# Patient Record
Sex: Male | Born: 1980 | Race: Black or African American | Hispanic: No | Marital: Married | Smoking: Never smoker
Health system: Southern US, Community
[De-identification: ages and names within clinical notes are randomized; demographics above are authoritative.]

---
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
## 2008-09-15 ENCOUNTER — Emergency Department (HOSPITAL_COMMUNITY): Admission: EM | Admit: 2008-09-15 | Discharge: 2008-09-16 | Payer: Self-pay | Admitting: Emergency Medicine

## 2010-08-19 LAB — POCT I-STAT, CHEM 8
BUN: 19 mg/dL (ref 6–23)
Calcium, Ion: 1.08 mmol/L — ABNORMAL LOW (ref 1.12–1.32)
Chloride: 107 mEq/L (ref 96–112)
Creatinine, Ser: 1.2 mg/dL (ref 0.4–1.5)
Glucose, Bld: 118 mg/dL — ABNORMAL HIGH (ref 70–99)
HCT: 48 % (ref 39.0–52.0)
Hemoglobin: 16.3 g/dL (ref 13.0–17.0)
Potassium: 3.7 mEq/L (ref 3.5–5.1)
Sodium: 140 mEq/L (ref 135–145)
TCO2: 26 mmol/L (ref 0–100)

## 2010-08-19 LAB — POCT CARDIAC MARKERS
CKMB, poc: 1 ng/mL (ref 1.0–8.0)
Myoglobin, poc: 45.3 ng/mL (ref 12–200)
Troponin i, poc: <0.05 ng/mL (ref 0.00–0.09)

## 2010-08-19 LAB — D-DIMER, QUANTITATIVE: D-Dimer, Quant: 0.24 ug/mL-FEU (ref 0.00–0.48)

## 2011-03-11 ENCOUNTER — Emergency Department (HOSPITAL_COMMUNITY)
Admission: EM | Admit: 2011-03-11 | Discharge: 2011-03-11 | Disposition: A | Discharge status: Home / Self Care | Payer: No Typology Code available for payment source | Attending: Emergency Medicine | Admitting: Emergency Medicine

## 2011-03-11 DIAGNOSIS — T1490XA Injury, unspecified, initial encounter: Secondary | ICD-10-CM | POA: Insufficient documentation

## 2011-03-11 DIAGNOSIS — Y92410 Unspecified street and highway as the place of occurrence of the external cause: Secondary | ICD-10-CM | POA: Insufficient documentation

## 2013-05-31 ENCOUNTER — Ambulatory Visit (INDEPENDENT_AMBULATORY_CARE_PROVIDER_SITE_OTHER): Payer: BC Managed Care – PPO | Admitting: Podiatry

## 2013-05-31 ENCOUNTER — Encounter: Payer: Self-pay | Admitting: Podiatry

## 2013-05-31 VITALS — BP 130/76 | HR 93 | Ht 68.0 in | Wt 210.0 lb

## 2013-05-31 DIAGNOSIS — L600 Ingrowing nail: Secondary | ICD-10-CM

## 2013-05-31 DIAGNOSIS — L03039 Cellulitis of unspecified toe: Secondary | ICD-10-CM | POA: Insufficient documentation

## 2013-05-31 MED ORDER — CEPHALEXIN 500 MG PO CAPS: 500.0000 mg | ORAL_CAPSULE | Freq: Four times a day (QID) | ORAL | Status: DC

## 2013-05-31 NOTE — Patient Instructions (Signed)
Seen for infected ingrown nail left great toe. Will do ingrown nail surgery this Friday so that he can take weekend off.  Take antibiotics as prescribed. Return this Friday for ingrown nail surgery.

## 2013-05-31 NOTE — Progress Notes (Signed)
Subjective: 33 year old male with infected ingrown nail on left great toe medial border for past 5 months.  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  He has this weekend off.  Objective: Draining ingrown nail left hallux medial border.  NO other abnormal findings.   Assessment: Infected ingrown nail left hallux medial border.   Plan: Will do Phenol and Alcohol matrixtectomy of the left great toe medial border.

## 2013-06-02 ENCOUNTER — Ambulatory Visit (INDEPENDENT_AMBULATORY_CARE_PROVIDER_SITE_OTHER): Payer: BC Managed Care – PPO | Admitting: Podiatry

## 2013-06-02 ENCOUNTER — Encounter: Payer: Self-pay | Admitting: Podiatry

## 2013-06-02 VITALS — BP 131/87 | HR 111 | Ht 68.0 in | Wt 210.0 lb

## 2013-06-02 DIAGNOSIS — L600 Ingrowing nail: Secondary | ICD-10-CM

## 2013-06-02 DIAGNOSIS — L03039 Cellulitis of unspecified toe: Secondary | ICD-10-CM

## 2013-06-02 NOTE — Progress Notes (Signed)
Seen for infected ingrown nail Patient came in to have ingrown nail surgery. He has taken antibiotics as prescribed. Left great toe injected with 5ml of 50/50 0.5% Marcaine plain and 1% Xylocaine with Epinephrine. Left great toe medial border ressected and cauterized nail matrix with Phenol and followed by Alcohol. Surgical wound dressed with Amerigel ointment.  Written home care instruction given.

## 2013-06-02 NOTE — Patient Instructions (Signed)
Ingrown nail surgery done on left great toe. Follow instruction and return next week.

## 2013-06-07 ENCOUNTER — Encounter: Payer: Self-pay | Admitting: Podiatry

## 2013-06-07 ENCOUNTER — Ambulatory Visit (INDEPENDENT_AMBULATORY_CARE_PROVIDER_SITE_OTHER): Payer: BC Managed Care – PPO | Admitting: Podiatry

## 2013-06-07 DIAGNOSIS — L600 Ingrowing nail: Secondary | ICD-10-CM

## 2013-06-07 NOTE — Progress Notes (Signed)
Follow up on ingrown nail surgery.  Wound healing is good without complication.  Continue to soak daily till wound gets completely dry. Return as needed.

## 2013-06-07 NOTE — Patient Instructions (Signed)
Follow up on left great toe nail surgery. Healing well. Continue to soak till pain stops and area gets completely dry. Return as needed.

## 2016-07-02 ENCOUNTER — Ambulatory Visit (INDEPENDENT_AMBULATORY_CARE_PROVIDER_SITE_OTHER): Payer: BC Managed Care – PPO | Admitting: Family

## 2016-07-02 ENCOUNTER — Encounter: Payer: Self-pay | Admitting: Family

## 2016-07-02 VITALS — BP 118/86 | HR 80 | Temp 98.3°F | Resp 16 | Ht 68.0 in | Wt 233.0 lb

## 2016-07-02 DIAGNOSIS — G43009 Migraine without aura, not intractable, without status migrainosus: Secondary | ICD-10-CM | POA: Diagnosis not present

## 2016-07-02 MED ORDER — RIZATRIPTAN BENZOATE 10 MG PO TABS: ORAL_TABLET | ORAL | 0 refills | Status: DC

## 2016-07-02 MED ORDER — TRAMADOL HCL 50 MG PO TABS: 50.0000 mg | ORAL_TABLET | Freq: Three times a day (TID) | ORAL | 0 refills | Status: DC | PRN

## 2016-07-02 NOTE — Patient Instructions (Signed)
Thank you for choosing [facility].  SUMMARY AND INSTRUCTIONS:  Medication:  Please start taking the Maxalt as needed for headaches.  Tramadol as needed for headaches not controlled by Tylenol or over the counter medications.   Your prescription(s) have been submitted to your pharmacy or been printed and provided for you. Please take as directed and contact our office if you believe you are having problem(s) with the medication(s) or have any questions.  Follow up:  If your symptoms worsen or fail to improve, please contact our office for further instruction, or in case of emergency go directly to the emergency room at the closest medical facility.     Migraine Headache A migraine headache is a very strong throbbing pain on one side or both sides of your head. Migraines can also cause other symptoms. Talk with your doctor about what things may bring on (trigger) your migraine headaches. Follow these instructions at home: Medicines  Take over-the-counter and prescription medicines only as told by your doctor.  Do not drive or use heavy machinery while taking prescription pain medicine.  To prevent or treat constipation while you are taking prescription pain medicine, your doctor may recommend that you:  Drink enough fluid to keep your pee (urine) clear or pale yellow.  Take over-the-counter or prescription medicines.  Eat foods that are high in fiber. These include fresh fruits and vegetables, whole grains, and beans.  Limit foods that are high in fat and processed sugars. These include fried and sweet foods. Lifestyle  Avoid alcohol.  Do not use any products that contain nicotine or tobacco, such as cigarettes and e-cigarettes. If you need help quitting, ask your doctor.  Get at least 8 hours of sleep every night.  Limit your stress. General instructions  Keep a journal to find out what may bring on your migraines. For example, write down:  What you eat and  drink.  How much sleep you get.  Any change in what you eat or drink.  Any change in your medicines.  If you have a migraine:  Avoid things that make your symptoms worse, such as bright lights.  It may help to lie down in a dark, quiet room.  Do not drive or use heavy machinery.  Ask your doctor what activities are safe for you.  Keep all follow-up visits as told by your doctor. This is important. Contact a doctor if:  You get a migraine that is different or worse than your usual migraines. Get help right away if:  Your migraine gets very bad.  You have a fever.  You have a stiff neck.  You have trouble seeing.  Your muscles feel weak or like you cannot control them.  You start to lose your balance a lot.  You start to have trouble walking.  You pass out (faint). This information is not intended to replace advice given to you by your health care provider. Make sure you discuss any questions you have with your health care provider. Document Released: 02/04/2008 Document Revised: 11/15/2015 Document Reviewed: 10/14/2015 Elsevier Interactive Patient Education  2017 [employer]

## 2016-07-02 NOTE — Assessment & Plan Note (Signed)
Symptoms and exam consistent with migraine headaches that are refractory to over-the-counter medications with increasing frequency. Neurological exam is normal. Discussed possibilities of abortive therapy and preventative therapy. Patient wishes to start with abortive therapy only. Start Maxalt as needed for migraines. Start tramadol as needed for headaches and controlled by over-the-counter medications. Advised to seek emergency care if develops worst headache of life. Due for an eye exam encouraged to see ophthalmology. Continue to monitor and follow-up in one month.

## 2016-07-02 NOTE — Progress Notes (Signed)
Subjective:    Patient ID: Dylan Sorrowharles P Pedraza Jr., male    DOB: 03/17/1981, 36 y.o.   MRN: [id number]  Chief Complaint  Patient presents with  . Establish Care    has issues with bad headaches x1 year has them almost every day    HPI:  Dylan SorrowCharles P Hamza Jr. is a 36 y.o. male who  has no past medical history on file. and presents today for an office visit to establish care.  Headaches - Associated symptoms of headaches have been going on for about 1 year. Denies any trauma or head injury. Headaches described as throbbing and located on the side of his temples. There is sensitivity to light and sound without associated nausea or vomiting. Modifying fators include Tylenol Extra-strength and essential oils which does not help very much. Does see spots on occasion. Frequency of the headaches is almost daily. No recent opthalmology exam done. Headache free days are 1-2 days per month. No testing done to this point and never evaluated. Has noted headaches are exacerbated with salty foods and pork. Denies worst headache of life.    No Known Allergies    Outpatient Medications Prior to Visit  Medication Sig Dispense Refill  . cephALEXin (KEFLEX) 500 MG capsule Take 1 capsule (500 mg total) by mouth 4 (four) times daily. 40 capsule 0   No facility-administered medications prior to visit.      History reviewed. No pertinent past medical history.    History reviewed. No pertinent surgical history.    Family History  Problem Relation Age of Onset  . Hypertension Mother   . Gout Father       Social History   Social History  . Marital status: Married    Spouse name: N/A  . Number of children: 0  . Years of education: 12   Occupational History  . Sport and exercise psychologistHousekeeping Supervisor    Social History Main Topics  . Smoking status: Never Smoker  . Smokeless tobacco: Never Used  . Alcohol use Yes     Comment: Socially  . Drug use: No  . Sexual activity: Not on file   Other Topics  Concern  . Not on file   Social History Narrative   Fun: Sleep       Review of Systems  Constitutional: Negative for chills and fever.  Eyes: Positive for photophobia. Negative for visual disturbance.  Respiratory: Negative for chest tightness and shortness of breath.   Cardiovascular: Negative for chest pain, palpitations and leg swelling.  Gastrointestinal: Negative for nausea and vomiting.  Neurological: Positive for headaches. Negative for dizziness, tremors, syncope and weakness.       Objective:    BP 118/86 (BP Location: Left Arm, Patient Position: Sitting, Cuff Size: Large)   Pulse 80   Temp 98.3 F (36.8 C) (Oral)   Resp 16   Ht 5\' 8"  (1.727 m)   Wt 233 lb (105.7 kg)   SpO2 98%   BMI 35.43 kg/m  Nursing note and vital signs reviewed.  Physical Exam  Constitutional: He is oriented to person, place, and time. He appears well-developed and well-nourished. No distress.  HENT:  Right Ear: Hearing, tympanic membrane, external ear and ear canal normal.  Left Ear: Hearing, tympanic membrane, external ear and ear canal normal.  Nose: Nose normal.  Mouth/Throat: Uvula is midline, oropharynx is clear and moist and mucous membranes are normal.  Eyes: Conjunctivae and EOM are normal. Pupils are equal, round, and reactive to light.  Cardiovascular:  Normal rate, regular rhythm, normal heart sounds and intact distal pulses.   Pulmonary/Chest: Effort normal and breath sounds normal. No respiratory distress. He has no wheezes. He has no rales. He exhibits no tenderness.  Neurological: He is alert and oriented to person, place, and time. No cranial nerve deficit.  Skin: Skin is warm and dry.  Psychiatric: He has a normal mood and affect. His behavior is normal. Judgment and thought content normal.        Assessment & Plan:   Problem List Items Addressed This Visit      Cardiovascular and Mediastinum   Migraine without aura and without status migrainosus, not intractable -  Primary    Symptoms and exam consistent with migraine headaches that are refractory to over-the-counter medications with increasing frequency. Neurological exam is normal. Discussed possibilities of abortive therapy and preventative therapy. Patient wishes to start with abortive therapy only. Start Maxalt as needed for migraines. Start tramadol as needed for headaches and controlled by over-the-counter medications. Advised to seek emergency care if develops worst headache of life. Due for an eye exam encouraged to see ophthalmology. Continue to monitor and follow-up in one month.      Relevant Medications   rizatriptan (MAXALT) 10 MG tablet   traMADol (ULTRAM) 50 MG tablet       I have discontinued Mr. Ramo's cephALEXin. I am also having him start on rizatriptan and traMADol.   Meds ordered this encounter  Medications  . rizatriptan (MAXALT) 10 MG tablet    Sig: Take 1 tablet by mouth at the onset of headache and may repeat in 2 hours if needed    Dispense:  10 tablet    Refill:  0    Order Specific Question:   Supervising Provider    Answer:   Hillard Danker A [[id number]]  . traMADol (ULTRAM) 50 MG tablet    Sig: Take 1 tablet (50 mg total) by mouth every 8 (eight) hours as needed.    Dispense:  30 tablet    Refill:  0    Order Specific Question:   Supervising Provider    Answer:   Hillard Danker A [[id number]]     Follow-up: Return in about 1 month (around 07/30/2016), or if symptoms worsen or fail to improve.   Jeanine Luz, FNP

## 2016-12-02 ENCOUNTER — Ambulatory Visit: Payer: BC Managed Care – PPO | Admitting: Family

## 2016-12-02 ENCOUNTER — Ambulatory Visit (INDEPENDENT_AMBULATORY_CARE_PROVIDER_SITE_OTHER): Payer: BC Managed Care – PPO | Admitting: Internal Medicine

## 2016-12-02 ENCOUNTER — Encounter: Payer: Self-pay | Admitting: Internal Medicine

## 2016-12-02 VITALS — BP 118/82 | HR 76 | Ht 68.0 in | Wt 214.0 lb

## 2016-12-02 DIAGNOSIS — L03116 Cellulitis of left lower limb: Secondary | ICD-10-CM | POA: Insufficient documentation

## 2016-12-02 DIAGNOSIS — M7989 Other specified soft tissue disorders: Secondary | ICD-10-CM | POA: Diagnosis not present

## 2016-12-02 MED ORDER — CEPHALEXIN 500 MG PO CAPS: 500.0000 mg | ORAL_CAPSULE | Freq: Three times a day (TID) | ORAL | 0 refills | Status: AC

## 2016-12-02 NOTE — Patient Instructions (Signed)
Please take all new medication as prescribed - the antibiotic  You will be contacted regarding the referral for: Left leg vein circulation test to make sure of no blood clot present  If the test is negative, and the antibiotic does not seem to work, please call for referral to Dematology  Please continue all other medications as before, and refills have been done if requested.  Please have the pharmacy call with any other refills you may need.  Please keep your appointments with your specialists as you may have planned

## 2016-12-02 NOTE — Progress Notes (Signed)
   Subjective:    Patient ID: Dylan Sorrowharles P Wedin Jr., male    DOB: [birth year], 36 y.o.   MRN: [id number]  HPI  Here to f./u with with 2-3 days onset distal LLE pain, redness, assoc with left leg swelling to below the knee, mild to mod, constant, not better with elevation, no recent trauma.  Has felt warm, but not sure about fever, chills, Pt denies chest pain, increased sob or doe, wheezing, orthopnea, PND,  palpitations, dizziness or syncope.   Pt denies new neurological symptoms such as new headache, or facial or extremity weakness or numbness   Pt denies polydipsia, polyuria, No past medical history on file. No past surgical history on file.  reports that he has never smoked. He has never used smokeless tobacco. He reports that he drinks alcohol. He reports that he does not use drugs. family history includes Gout in his father; Hypertension in his mother. No Known Allergies Current Outpatient Prescriptions on File Prior to Visit  Medication Sig Dispense Refill  . rizatriptan (MAXALT) 10 MG tablet Take 1 tablet by mouth at the onset of headache and may repeat in 2 hours if needed 10 tablet 0  . traMADol (ULTRAM) 50 MG tablet Take 1 tablet (50 mg total) by mouth every 8 (eight) hours as needed. 30 tablet 0   No current facility-administered medications on file prior to visit.    Review of Systems  Constitutional: Negative for other unusual diaphoresis or sweats HENT: Negative for ear discharge or swelling Eyes: Negative for other worsening visual disturbances Respiratory: Negative for stridor or other swelling  Gastrointestinal: Negative for worsening distension or other blood Genitourinary: Negative for retention or other urinary change Musculoskeletal: Negative for other MSK pain or swelling Skin: Negative for color change or other new lesions Neurological: Negative for worsening tremors and other numbness  Psychiatric/Behavioral: Negative for worsening agitation or other fatigue All  other system neg per pt    Objective:   Physical Exam BP 118/82   Pulse 76   Ht 5\' 8"  (1.727 m)   Wt 214 lb (97.1 kg)   SpO2 98%   BMI 32.54 kg/m  VS noted, non toxic Constitutional: Pt appears in NAD HENT: Head: NCAT.  Right Ear: External ear normal.  Left Ear: External ear normal.  Eyes: . Pupils are equal, round, and reactive to light. Conjunctivae and EOM are normal Nose: without d/c or deformity Neck: Neck supple. Gross normal ROM Cardiovascular: Normal rate and regular rhythm.   Pulmonary/Chest: Effort normal and breath sounds without rales or wheezing.  Neurological: Pt is alert. At baseline orientation, motor grossly intact Skin: Skin is warm. No rashes, other new lesions, but has distal LLE 4x4n cm redness, tender slightly raised approx 2x2 cm area about 2 cm above the ankle pretibial aspect with 1-2+ LLE edema to knee Psychiatric: Pt behavior is normal without agitation  No other exam findings    Assessment & Plan:

## 2016-12-04 ENCOUNTER — Ambulatory Visit (HOSPITAL_COMMUNITY)
Admission: RE | Admit: 2016-12-04 | Discharge: 2016-12-04 | Disposition: A | Discharge status: Home / Self Care | Payer: BC Managed Care – PPO | Source: Ambulatory Visit | Attending: Internal Medicine | Admitting: Internal Medicine

## 2016-12-04 ENCOUNTER — Telehealth: Payer: Self-pay

## 2016-12-04 DIAGNOSIS — M7989 Other specified soft tissue disorders: Secondary | ICD-10-CM | POA: Diagnosis not present

## 2016-12-04 NOTE — Telephone Encounter (Signed)
Call report that LLE venous was negative.

## 2016-12-04 NOTE — Telephone Encounter (Signed)
Please inform patient if he is not aware.

## 2016-12-05 NOTE — Assessment & Plan Note (Signed)
Also cant r/o CDVT - for LLE venous doppler asap

## 2016-12-05 NOTE — Assessment & Plan Note (Signed)
Mild to mod, for antibx course,  to f/u any worsening symptoms or concerns 

## 2016-12-07 NOTE — Telephone Encounter (Signed)
Pt informed that DVT test was negative.

## 2016-12-08 ENCOUNTER — Encounter: Payer: Self-pay | Admitting: Internal Medicine

## 2017-04-21 ENCOUNTER — Telehealth: Payer: Self-pay | Admitting: Family

## 2017-04-21 ENCOUNTER — Ambulatory Visit: Payer: BC Managed Care – PPO | Admitting: Family

## 2017-04-21 ENCOUNTER — Emergency Department (HOSPITAL_COMMUNITY)
Admission: EM | Admit: 2017-04-21 | Discharge: 2017-04-21 | Discharge status: Absent without leave | Payer: BC Managed Care – PPO

## 2017-04-21 ENCOUNTER — Encounter: Payer: Self-pay | Admitting: Family

## 2017-04-21 VITALS — BP 142/100 | HR 104 | Temp 98.8°F | Ht 68.0 in | Wt 223.0 lb

## 2017-04-21 DIAGNOSIS — J111 Influenza due to unidentified influenza virus with other respiratory manifestations: Secondary | ICD-10-CM | POA: Diagnosis not present

## 2017-04-21 DIAGNOSIS — R509 Fever, unspecified: Secondary | ICD-10-CM | POA: Diagnosis not present

## 2017-04-21 LAB — POCT RAPID STREP A (OFFICE): Rapid Strep A Screen: NEGATIVE

## 2017-04-21 LAB — POC INFLUENZA A&B (BINAX/QUICKVUE)
Influenza A, POC: POSITIVE — AB
Influenza B, POC: NEGATIVE

## 2017-04-21 MED ORDER — OSELTAMIVIR PHOSPHATE 75 MG PO CAPS: 75.0000 mg | ORAL_CAPSULE | Freq: Two times a day (BID) | ORAL | 0 refills | Status: DC

## 2017-04-21 NOTE — Progress Notes (Signed)
  Dylan Thornton. is a 36 y.o. male with the following history as recorded in [employer]:  Patient Active Problem List   Diagnosis Date Noted  . Left leg cellulitis 12/02/2016  . Left leg swelling 12/02/2016  . Migraine without aura and without status migrainosus, not intractable 07/02/2016  . Onychia and paronychia of toe 05/31/2013  . Ingrown nail 05/31/2013    Current Outpatient Medications  Medication Sig Dispense Refill  . rizatriptan (MAXALT) 10 MG tablet Take 1 tablet by mouth at the onset of headache and may repeat in 2 hours if needed 10 tablet 0  . traMADol (ULTRAM) 50 MG tablet Take 1 tablet (50 mg total) by mouth every 8 (eight) hours as needed. 30 tablet 0  . oseltamivir (TAMIFLU) 75 MG capsule Take 1 capsule (75 mg total) by mouth 2 (two) times daily. 10 capsule 0   No current facility-administered medications for this visit.     Allergies: Patient has no known allergies.  History reviewed. No pertinent past medical history.  History reviewed. No pertinent surgical history.  Family History  Problem Relation Age of Onset  . Hypertension Mother   . Gout Father     Social History   Tobacco Use  . Smoking status: Never Smoker  . Smokeless tobacco: Never Used  Substance Use Topics  . Alcohol use: Yes    Comment: Socially    Subjective:  Patient presents with concerns for body aches, "severe headache", dizziness, weakness; does complain of neck pain as well but notes he is not light sensitive and can turn his head; he has been running "high fevers" per his wife who accompanies him today. Does not have a history of hypertension.   Objective:  Vitals:   04/21/17 1435  BP: (!) 142/100  Pulse: (!) 104  Temp: 98.8 F (37.1 C)  TempSrc: Oral  SpO2: 98%  Weight: 223 lb (101.2 kg)  Height: 5\' 8"  (1.727 m)    General: Well developed, well nourished, in mild distress; actively vomiting in room; sweat noted on forehead/ clothes are soaked Skin : Warm and dry.  Head:  Normocephalic and atraumatic  Eyes: Sclera and conjunctiva clear; pupils round and reactive to light; extraocular movements intact  Ears: External normal; canals clear; tympanic membranes normal  Oropharynx: Pink, supple. No suspicious lesions  Neck: Supple without thyromegaly, adenopathy  Lungs: Respirations unlabored; clear to auscultation bilaterally without wheeze, rales, rhonchi  CVS exam: normal rate and regular rhythm.  Neurologic: Alert and oriented; speech intact; face symmetrical; moves all extremities well; CNII-XII intact without focal deficit   Assessment:  1. Flu   2. Fever, unspecified fever cause     Plan:  Rapid flu in office is positive; will send in Rx for Tamiflu 75 mg bid x 5 days; based on clinical presentation however, am concerned for dehydration; recommend ER evaluation for possible IV fluids; with severity of headache, meningitis also needs to be considered; follow-up as needed; work note given for remainder of the week.   No Follow-up on file.  Orders Placed This Encounter  Procedures  . POCT rapid strep A  . POC Influenza A&B (Binax test)    Requested Prescriptions   Signed Prescriptions Disp Refills  . oseltamivir (TAMIFLU) 75 MG capsule 10 capsule 0    Sig: Take 1 capsule (75 mg total) by mouth 2 (two) times daily.

## 2017-04-21 NOTE — Telephone Encounter (Unsigned)
Copied from CRM [id number]. Topic: General - Other >> Apr 21, 2017  3:51 PM Raquel SarnaHayes, Teresa G wrote: [facility] called stating they do not take walk in pts.  Pts said someone from the practice told him to come to the hospital for fluids.  [facility] is sending him to the ER.

## 2017-04-22 ENCOUNTER — Ambulatory Visit: Payer: Self-pay

## 2017-04-22 NOTE — Telephone Encounter (Signed)
Pt. and Fiance called to report pt c/o mid to lower sternal chest pain.  Reported the pain started at about 3:00 AM.  Pt. stated the pain is dull, and contant in the front of the chest.  Stated when he coughs, the pain goes through to the back.  Also, reported he feels sweaty.  Denied radiation of pain to the neck, jaw, shoulder, or left arm.  Reported he was diagnosed with the flu yesterday.  Evaluated in the office, and was advised to go to the ER yesterday.  C/o intermittent cough with mucus.  Stated temp. last night in the ER was 102 degrees.  Reported they waited in the ER awhile, and due to the amount of wait time, were advised to go home and get the Tamiflu started.  Fiance reported he was not having chest pain at that time, as it started in middle of night.  Reported last dose of Tylenol was at 7:30 PM last night.  Pt. has not checked temp. today.  Denies shortness of breath.  Reported some chest discomfort with taking deep breath.    Call[facility] Flow Coordinator at [facility].  Advised to send to ER.  Advised pt. and fiance to go to the ER right away, to r/o any cardiac involvement.  Verb. Understanding.     Reason for Disposition . Taking a deep breath makes pain worse  Answer Assessment - Initial Assessment Questions 1. LOCATION: "Where does it hurt?"       Middle of chest on lower half of sternum 2. RADIATION: "Does the pain go anywhere else?" (e.g., into neck, jaw, arms, back)     With cough pain radiates from middle to back; c/o ant. Chest pain at rest 3. ONSET: "When did the chest pain begin?" (Minutes, hours or days)      When he woke up about 3:00 AM 4. PATTERN "Does the pain come and go, or has it been constant since it started?"  "Does it get worse with exertion?"      Constant  5. DURATION: "How long does it last" (e.g., seconds, minutes, hours)     constant 6. SEVERITY: "How bad is the pain?"  (e.g., Scale 1-10; mild, moderate, or severe)    - MILD (1-3): doesn't interfere with  normal activities     - MODERATE (4-7): interferes with normal activities or awakens from sleep    - SEVERE (8-10): excruciating pain, unable to do any normal activities       Moderate pain @ 5/10 7. CARDIAC RISK FACTORS: "Do you have any history of heart problems or risk factors for heart disease?" (e.g., prior heart attack, angina; high blood pressure, diabetes, being overweight, high cholesterol, smoking, or strong family history of heart disease)    Strong family hx heart disease; aunt  608. PULMONARY RISK FACTORS: "Do you have any history of lung disease?"  (e.g., blood clots in lung, asthma, emphysema, birth control pills)     No ; no asthma or emphysema 9. CAUSE: "What do you think is causing the chest pain?"     Unknown  10. OTHER SYMPTOMS: "Do you have any other symptoms?" (e.g., dizziness, nausea, vomiting, sweating, fever, difficulty breathing, cough)       Feels sweaty, nausea/ fever of 102 degrees at the ER 11. PREGNANCY: "Is there any chance you are pregnant?" "When was your last menstrual period?"       N/a  Protocols used: CHEST PAIN-A-AH

## 2017-07-28 ENCOUNTER — Ambulatory Visit: Payer: BC Managed Care – PPO | Admitting: Family

## 2017-07-28 DIAGNOSIS — Z0289 Encounter for other administrative examinations: Secondary | ICD-10-CM

## 2017-09-23 ENCOUNTER — Ambulatory Visit: Payer: BC Managed Care – PPO | Admitting: Nurse Practitioner

## 2017-09-24 ENCOUNTER — Encounter: Payer: Self-pay | Admitting: Family

## 2017-09-24 ENCOUNTER — Ambulatory Visit (INDEPENDENT_AMBULATORY_CARE_PROVIDER_SITE_OTHER): Payer: BC Managed Care – PPO | Admitting: Family

## 2017-09-24 ENCOUNTER — Other Ambulatory Visit (INDEPENDENT_AMBULATORY_CARE_PROVIDER_SITE_OTHER): Payer: BC Managed Care – PPO

## 2017-09-24 VITALS — BP 118/78 | HR 77 | Temp 98.0°F | Ht 66.0 in | Wt 213.8 lb

## 2017-09-24 DIAGNOSIS — Z23 Encounter for immunization: Secondary | ICD-10-CM

## 2017-09-24 DIAGNOSIS — Z8269 Family history of other diseases of the musculoskeletal system and connective tissue: Secondary | ICD-10-CM

## 2017-09-24 DIAGNOSIS — Z13220 Encounter for screening for lipoid disorders: Secondary | ICD-10-CM | POA: Diagnosis not present

## 2017-09-24 DIAGNOSIS — Z0000 Encounter for general adult medical examination without abnormal findings: Secondary | ICD-10-CM

## 2017-09-24 DIAGNOSIS — G43009 Migraine without aura, not intractable, without status migrainosus: Secondary | ICD-10-CM

## 2017-09-24 DIAGNOSIS — Z114 Encounter for screening for human immunodeficiency virus [HIV]: Secondary | ICD-10-CM

## 2017-09-24 LAB — LIPID PANEL
CHOLESTEROL: 174 mg/dL (ref 0–200)
HDL: 37.4 mg/dL — ABNORMAL LOW (ref >39.00)
LDL CALC: 119 mg/dL — AB (ref 0–99)
NonHDL: 136.4
Total CHOL/HDL Ratio: 5
Triglycerides: 88 mg/dL (ref 0.0–149.0)
VLDL: 17.6 mg/dL (ref 0.0–40.0)

## 2017-09-24 LAB — COMPREHENSIVE METABOLIC PANEL
ALBUMIN: 4.6 g/dL (ref 3.5–5.2)
ALT: 16 U/L (ref 0–53)
AST: 18 U/L (ref 0–37)
Alkaline Phosphatase: 74 U/L (ref 39–117)
BILIRUBIN TOTAL: 1 mg/dL (ref 0.2–1.2)
BUN: 15 mg/dL (ref 6–23)
CHLORIDE: 103 meq/L (ref 96–112)
CO2: 31 meq/L (ref 19–32)
CREATININE: 1.16 mg/dL (ref 0.40–1.50)
Calcium: 9.4 mg/dL (ref 8.4–10.5)
GFR: 92.38 mL/min (ref >60.00)
Glucose, Bld: 91 mg/dL (ref 70–99)
Potassium: 4.4 mEq/L (ref 3.5–5.1)
SODIUM: 139 meq/L (ref 135–145)
Total Protein: 7.4 g/dL (ref 6.0–8.3)

## 2017-09-24 LAB — CBC WITH DIFFERENTIAL/PLATELET
BASOS ABS: 0 10*3/uL (ref 0.0–0.1)
BASOS PCT: 0.6 % (ref 0.0–3.0)
Eosinophils Absolute: 0 10*3/uL (ref 0.0–0.7)
Eosinophils Relative: 0.9 % (ref 0.0–5.0)
HEMATOCRIT: 47.9 % (ref 39.0–52.0)
Hemoglobin: 16.3 g/dL (ref 13.0–17.0)
LYMPHS PCT: 38.1 % (ref 12.0–46.0)
Lymphs Abs: 1.8 10*3/uL (ref 0.7–4.0)
MCHC: 33.9 g/dL (ref 30.0–36.0)
MCV: 85 fl (ref 78.0–100.0)
MONOS PCT: 10.6 % (ref 3.0–12.0)
Monocytes Absolute: 0.5 10*3/uL (ref 0.1–1.0)
NEUTROS ABS: 2.4 10*3/uL (ref 1.4–7.7)
Neutrophils Relative %: 49.8 % (ref 43.0–77.0)
PLATELETS: 248 10*3/uL (ref 150.0–400.0)
RBC: 5.64 Mil/uL (ref 4.22–5.81)
RDW: 14.2 % (ref 11.5–15.5)
WBC: 4.8 10*3/uL (ref 4.0–10.5)

## 2017-09-24 LAB — URIC ACID: Uric Acid, Serum: 8.1 mg/dL — ABNORMAL HIGH (ref 4.0–7.8)

## 2017-09-24 MED ORDER — RIZATRIPTAN BENZOATE 10 MG PO TABS: ORAL_TABLET | ORAL | 1 refills | Status: DC

## 2017-09-24 NOTE — Progress Notes (Signed)
Dylan Thornton. is a 37 y.o. male with the following history as recorded in [employer]:  Patient Active Problem List   Diagnosis Date Noted  . Left leg cellulitis 12/02/2016  . Left leg swelling 12/02/2016  . Migraine without aura and without status migrainosus, not intractable 07/02/2016  . Onychia and paronychia of toe 05/31/2013  . Ingrown nail 05/31/2013    Current Outpatient Medications  Medication Sig Dispense Refill  . rizatriptan (MAXALT) 10 MG tablet Take 1 tablet by mouth at the onset of headache and may repeat in 2 hours if needed 10 tablet 1   No current facility-administered medications for this visit.     Allergies: Patient has no known allergies.  History reviewed. No pertinent past medical history.  History reviewed. No pertinent surgical history.  Family History  Problem Relation Age of Onset  . Hypertension Mother   . Gout Father     Social History   Tobacco Use  . Smoking status: Never Smoker  . Smokeless tobacco: Never Used  Substance Use Topics  . Alcohol use: Yes    Comment: Socially    Subjective:  Patient presents for yearly CPE; will be getting married next Saturday; in baseline state of health with no concerns today; exercises regularly at work; has lost almost 20 pounds in the past year by limiting sodas, eating better; dentist every 6 months; agrees to update Tdap today; does have FH of gout- no personal symptoms; may have one migraine every 3-4 months- frequency much less since changing his diet; would like a refill on his Maxalt;   Objective:  Vitals:   09/24/17 1121  BP: 118/78  Pulse: 77  Temp: 98 F (36.7 C)  TempSrc: Oral  SpO2: 96%  Weight: 213 lb 12 oz (97 kg)  Height: '5\' 6"'  (1.676 m)    General: Well developed, well nourished, in no acute distress  Skin : Warm and dry.  Head: Normocephalic and atraumatic  Eyes: Sclera and conjunctiva clear; pupils round and reactive to light; extraocular movements intact  Ears: External  normal; canals clear; tympanic membranes normal  Oropharynx: Pink, supple. No suspicious lesions  Neck: Supple without thyromegaly, adenopathy  Lungs: Respirations unlabored; clear to auscultation bilaterally without wheeze, rales, rhonchi  CVS exam: normal rate and regular rhythm.  Abdomen: Soft; nontender; nondistended; normoactive bowel sounds; no masses or hepatosplenomegaly  Musculoskeletal: No deformities; no active joint inflammation  Extremities: No edema, cyanosis, clubbing  Vessels: Symmetric bilaterally  Neurologic: Alert and oriented; speech intact; face symmetrical; moves all extremities well; CNII-XII intact without focal deficit  Assessment:  1. PE (physical exam), annual   2. Need for Tdap vaccination   3. Migraine without aura and without status migrainosus, not intractable   4. Lipid screening   5. Family history of gout   6. Encounter for screening for HIV     Plan:  Congratulated patient on commitment to health; continue regular dental and vision exams; Tdap updated; labs updated- will be in touch once results obtained; follow-up in 1-2 years, sooner prn.   No follow-ups on file.  Orders Placed This Encounter  Procedures  . Tdap vaccine greater than or equal to 7yo IM  . CBC w/Diff    Standing Status:   Future    Number of Occurrences:   1    Standing Expiration Date:   09/24/2018  . Comp Met (CMET)    Standing Status:   Future    Number of Occurrences:   1  Standing Expiration Date:   09/24/2018  . Lipid panel    Standing Status:   Future    Number of Occurrences:   1    Standing Expiration Date:   09/25/2018  . HIV antibody    Standing Status:   Future    Number of Occurrences:   1    Standing Expiration Date:   09/25/2018  . Uric acid    Standing Status:   Future    Number of Occurrences:   1    Standing Expiration Date:   09/24/2018    Requested Prescriptions   Signed Prescriptions Disp Refills  . rizatriptan (MAXALT) 10 MG tablet 10 tablet 1     Sig: Take 1 tablet by mouth at the onset of headache and may repeat in 2 hours if needed

## 2017-09-24 NOTE — Patient Instructions (Signed)
Congratulations!! Enjoy your honeymoon! Let us know if you need anything.

## 2017-09-25 LAB — HIV ANTIBODY (ROUTINE TESTING W REFLEX): HIV: NONREACTIVE

## 2017-09-28 ENCOUNTER — Other Ambulatory Visit: Payer: Self-pay | Admitting: Family

## 2017-09-28 DIAGNOSIS — R899 Unspecified abnormal finding in specimens from other organs, systems and tissues: Secondary | ICD-10-CM

## 2018-06-02 ENCOUNTER — Encounter: Payer: Self-pay | Admitting: Family Medicine

## 2018-06-02 ENCOUNTER — Ambulatory Visit: Payer: BC Managed Care – PPO | Admitting: Family Medicine

## 2018-06-02 VITALS — BP 132/82 | HR 105 | Temp 97.4°F | Ht 66.0 in | Wt 228.1 lb

## 2018-06-02 DIAGNOSIS — G43009 Migraine without aura, not intractable, without status migrainosus: Secondary | ICD-10-CM

## 2018-06-02 MED ORDER — TOPIRAMATE 25 MG PO TABS: ORAL_TABLET | ORAL | 1 refills | Status: DC

## 2018-06-02 NOTE — Progress Notes (Signed)
Patient ID: Dylan Sorrow., male   DOB: [birth year], 38 y.o.   MRN: [id number]  PCP: Olive Bass, FNP  Subjective:  Dylan Sorrow. is a 38 y.o. year old very pleasant male patient who presents with a Headache that occurred yesterday. He is seeking care today as he experiences migraines every 2 to 3 months but has experienced more this past month. He reports that he has treated more than 10 days/month at times and states this may have occurred over the past 3 months.  -started: yesterday , symptoms improved after taking Excedrin extra strength -Pain: 10 Quality: throbbing and unilateral Radiation: No -Duration: can last 6 to 8 hours and improves with excedrin -Trigger: None noted  -Trauma: No -Stressors: None -Relation to Food/Alcohol: No -Nausea/Vomiting: Nausea without vomiting -Fever: No -Weight Loss: No -History of CA/immunosuppresion: No -Visual Changes: He reports some intermittent blurry vision but did not notice this yesterday. -Seizures: No Light and sound sensitivity present with headaches.  -previous treatments: Excedrin has provided benefit -sick contacts/travel/risks: No recent sick contact exposure -Family Hx of headache: No  ROS-denies fever, chills, thunderclap HA, sinus pressure/pain, ear pain,   Pertinent Past Medical History- migraines  Medications- reviewed  Current Outpatient Medications  Medication Sig Dispense Refill  . rizatriptan (MAXALT) 10 MG tablet Take 1 tablet by mouth at the onset of headache and may repeat in 2 hours if needed (Patient not taking: Reported on 06/02/2018) 10 tablet 1   No current facility-administered medications for this visit.     Objective: BP 132/82 (BP Location: Left Arm, Patient Position: Sitting, Cuff Size: Normal)   Pulse (!) 105   Temp (!) 97.4 F (36.3 C) (Oral)   Ht 5\' 6"  (1.676 m)   Wt 228 lb 1.9 oz (103.5 kg)   SpO2 94%   BMI 36.82 kg/m  Retake of HR:  98 Gen: NAD, resting comfortably HEENT:  Oropharynx clear and moist CV: RRR no murmurs rubs or gallops Lungs: CTAB no crackles, wheeze, rhonchi Abdomen: soft/nontender/nondistended/normal bowel sounds. No rebound or guarding.  Ext: no edema Skin: warm, dry, no rash Neuro: II-Visual fields grossly intact. III/IV/VI-Extraocular movements intact. Pupils reactive bilaterally. V/VII-Smile symmetric, equal eyebrow raise, facial sensation intact VIII- Hearing grossly intact XI-bilateral shoulder shrug XII-midline tongue extension Motor: 5/5 bilaterally with normal tone and bulk Cerebellar: Normal finger-to-nose and normal heel-to-shin test.  Romberg negative Ambulates with a coordinated gait  Assessment/Plan: 1. Migraine without aura and without status migrainosus, not intractable Exam and history are most consistent with migraines that have increased in frequency and there is some suspicion of medication overuse headache/chronic migraines present with treatment > 10 days/month. Neuro exam is normal and reassuring today. We reviewed abortive and preventive therapy today. He is interested in a trial of preventive therapy today with a goal to decrease acute therapy. Will trial topamax 25 mg once daily x 7 days and increase to BID. He will avoid dietary triggers and keep a headache diary to further determine patterns/triggers. He will follow up with PCP after trial of medication in the next 2 weeks or sooner if needed. Further advised him that if symptoms are not improving, imaging can be considered.  - topiramate (TOPAMAX) 25 MG tablet; Take one tablet before bed for 7 days and then increase to one tablet two times daily  Dispense: 30 tablet; Refill: 1    Inez Catalina, FNP

## 2018-06-02 NOTE — Patient Instructions (Signed)
Please start taking medication once daily at night for one week then increase to one tablet twice a day.  It is important to keep a diary of your headaches and also follow up with Vernona Rieger for further evaluation.  If symptoms are not improving, worsen, please seek care for further evaluation.   Migraine Headache  A migraine headache is a very strong throbbing pain on one side or both sides of your head. Migraines can also cause other symptoms. Talk with your doctor about what things may bring on (trigger) your migraine headaches. Follow these instructions at home: Medicines  Take over-the-counter and prescription medicines only as told by your doctor.  Do not drive or use heavy machinery while taking prescription pain medicine.  To prevent or treat constipation while you are taking prescription pain medicine, your doctor may recommend that you: ? Drink enough fluid to keep your pee (urine) clear or pale yellow. ? Take over-the-counter or prescription medicines. ? Eat foods that are high in fiber. These include fresh fruits and vegetables, whole grains, and beans. ? Limit foods that are high in fat and processed sugars. These include fried and sweet foods. Lifestyle  Avoid alcohol.  Do not use any products that contain nicotine or tobacco, such as cigarettes and e-cigarettes. If you need help quitting, ask your doctor.  Get at least 8 hours of sleep every night.  Limit your stress. General instructions   Keep a journal to find out what may bring on your migraines. For example, write down: ? What you eat and drink. ? How much sleep you get. ? Any change in what you eat or drink. ? Any change in your medicines.  If you have a migraine: ? Avoid things that make your symptoms worse, such as bright lights. ? It may help to lie down in a dark, quiet room. ? Do not drive or use heavy machinery. ? Ask your doctor what activities are safe for you.  Keep all follow-up visits as told  by your doctor. This is important. Contact a doctor if:  You get a migraine that is different or worse than your usual migraines. Get help right away if:  Your migraine gets very bad.  You have a fever.  You have a stiff neck.  You have trouble seeing.  Your muscles feel weak or like you cannot control them.  You start to lose your balance a lot.  You start to have trouble walking.  You pass out (faint). This information is not intended to replace advice given to you by your health care provider. Make sure you discuss any questions you have with your health care provider. Document Released: 02/04/2008 Document Revised: 01/19/2018 Document Reviewed: 10/14/2015 Elsevier Interactive Patient Education  2019 [employer].

## 2018-07-12 ENCOUNTER — Ambulatory Visit: Payer: BC Managed Care – PPO | Admitting: Family Medicine

## 2018-07-12 ENCOUNTER — Encounter: Payer: Self-pay | Admitting: Family Medicine

## 2018-07-12 ENCOUNTER — Ambulatory Visit: Payer: Self-pay

## 2018-07-12 VITALS — BP 126/92 | HR 104 | Ht 66.0 in | Wt 228.0 lb

## 2018-07-12 DIAGNOSIS — M25511 Pain in right shoulder: Principal | ICD-10-CM

## 2018-07-12 DIAGNOSIS — G8929 Other chronic pain: Secondary | ICD-10-CM

## 2018-07-12 DIAGNOSIS — M7551 Bursitis of right shoulder: Secondary | ICD-10-CM | POA: Insufficient documentation

## 2018-07-12 MED ORDER — GABAPENTIN 100 MG PO CAPS: 200.0000 mg | ORAL_CAPSULE | Freq: Every day | ORAL | 3 refills | Status: DC

## 2018-07-12 MED ORDER — TIZANIDINE HCL 4 MG PO TABS: 4.0000 mg | ORAL_TABLET | Freq: Every evening | ORAL | 2 refills | Status: AC

## 2018-07-12 NOTE — Assessment & Plan Note (Signed)
Patient coming in action.  Discussed icing regimen exercises.  Discussed which activities to do which wants to avoid.  Discussed topical anti-inflammatories.  Follow-up again 4 to 8 weeks.

## 2018-07-12 NOTE — Patient Instructions (Addendum)
Good to see you  Ice is your friend Ice 20 minutes 2 times daily. Usually after activity and before bed. Injected shoulder today   Exercises 3 times a week.  Gabapentin 200mg  at night If need something else use  zanaflex at night See me again in 4 weeks

## 2018-07-12 NOTE — Progress Notes (Signed)
[name] [address] [place], [place] [address] Phone: [phone] Subjective:   Dylan Thornton, am serving as a scribe for Dr. Antoine Thornton.   CC: Right shoulder pain  GEX:[id number]  Dylan Thornton. is a 38 y.o. male coming in with complaint of right shoulder pain. Wakes up in pain and has pain with flexion over the deltoid. Uses Tylenol to sleep. Denies any radiating symptoms. Has had similar pain a couple years ago and as given Toradal for pain. 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 Rates the severity of pain is 7 out of 10     No past medical history on file. No past surgical history on file. Social History   Socioeconomic History  . Marital status: Married    Spouse name: Not on file  . Number of children: 0  . Years of education: 55  . Highest education level: Not on file  Occupational History  . Occupation: Sport and exercise psychologist  Social Needs  . Financial resource strain: Not on file  . Food insecurity:    Worry: Not on file    Inability: Not on file  . Transportation needs:    Medical: Not on file    Non-medical: Not on file  Tobacco Use  . Smoking status: Never Smoker  . Smokeless tobacco: Never Used  Substance and Sexual Activity  . Alcohol use: Yes    Comment: Socially  . Drug use: No  . Sexual activity: Not on file  Lifestyle  . Physical activity:    Days per week: Not on file    Minutes per session: Not on file  . Stress: Not on file  Relationships  . Social connections:    Talks on phone: Not on file    Gets together: Not on file    Attends religious service: Not on file    Active member of club or organization: Not on file    Attends meetings of clubs or organizations: Not on file    Relationship status: Not on file  Other Topics Concern  . Not on file  Social History Narrative   Fun: Sleep    No Known  Allergies Family History  Problem Relation Age of Onset  . Hypertension Mother   . Gout Father        Current Outpatient Medications (Analgesics):  .  rizatriptan (MAXALT) 10 MG tablet, Take 1 tablet by mouth at the onset of headache and may repeat in 2 hours if needed   Current Outpatient Medications (Other):  .  topiramate (TOPAMAX) 25 MG tablet, Take one tablet before bed for 7 days and then increase to one tablet two times daily .  gabapentin (NEURONTIN) 100 MG capsule, Take 2 capsules (200 mg total) by mouth at bedtime. Marland Kitchen  tiZANidine (ZANAFLEX) 4 MG tablet, Take 1 tablet (4 mg total) by mouth Nightly for 10 days.    Past medical history, social, surgical and family history all reviewed in electronic medical record.  No pertanent information unless stated regarding to the chief complaint.   Review of Systems:  No headache, visual changes, nausea, vomiting, diarrhea, constipation, dizziness, abdominal pain, skin rash, fevers, chills, night sweats, weight loss, swollen lymph nodes, body aches, joint swelling, chest pain, shortness of breath, mood changes.  Positive muscle aches  Objective  Blood pressure (!) 126/92, pulse (!) 104, height 5'  6" (1.676 m), weight 228 lb (103.4 kg), SpO2 95 %.   General: No apparent distress alert and oriented x3 mood and affect normal, dressed appropriately.  HEENT: Pupils equal, extraocular movements intact  Respiratory: Patient's speak in full sentences and does not appear short of breath  Cardiovascular: No lower extremity edema, non tender, no erythema  Skin: Warm dry intact with no signs of infection or rash on extremities or on axial skeleton.  Abdomen: Soft nontender  Neuro: Cranial nerves II through XII are intact, neurovascularly intact in all extremities with 2+ DTRs and 2+ pulses.  Lymph: No lymphadenopathy of posterior or anterior cervical chain or axillae bilaterally.  Gait normal with good balance and coordination.  MSK:  Non  tender with full range of motion and good stability and symmetric strength and tone of  elbows, wrist, hip, knee and ankles bilaterally.  Shoulder: Right Inspection reveals no abnormalities, atrophy or asymmetry. Palpation is normal with no tenderness over AC joint or bicipital groove. ROM is full in all planes passively. Rotator cuff strength normal throughout. signs of impingement with positive Neer and Hawkin's tests, but negative empty can sign. Speeds and Yergason's tests normal. No labral pathology noted with negative Obrien's, negative clunk and good stability. Normal scapular function observed. No painful arc and no drop arm sign. No apprehension sign  MSK US performed of: Right This study was ordered, performed, and interpreted by Dylan Thornton D.O.  Shoulder:   Supraspinatus:  Appears normal on long and transverse views, Bursal bulge seen with shoulder abduction on impingement view. Infraspinatus:  Appears normal on long and transverse views. Significant increase in Doppler flow Subscapularis:  Appears normal on long and transverse views. Positive bursa Teres Minor:  Appears normal on long and transverse views. AC joint:  Capsule undistended, no geyser sign. Glenohumeral Joint:  Appears normal without effusion. Glenoid Labrum:  Intact without visualized tears. Biceps Tendon:  Appears normal on long and transverse views, no fraying of tendon, tendon located in intertubercular groove, no subluxation with shoulder internal or external rotation.  Impression: Subacromial bursitis  Procedure: Real-time Ultrasound Guided Injection of right glenohumeral joint Device: GE Logiq E  Ultrasound guided injection is preferred based studies that show increased duration, increased effect, greater accuracy, decreased procedural pain, increased response rate with ultrasound guided versus blind injection.  Verbal informed consent obtained.  Time-out conducted.  Noted no overlying erythema,  induration, or other signs of local infection.  Skin prepped in a sterile fashion.  Local anesthesia: Topical Ethyl chloride.  With sterile technique and under real time ultrasound guidance:  Joint visualized.  23g 1  inch needle inserted posterior approach. Pictures taken for needle placement. Patient did have injection of 2 cc of 1% lidocaine, 2 cc of 0.5% Marcaine, and 1.0 cc of Kenalog 40 mg/dL. Completed without difficulty  Pain immediately resolved suggesting accurate placement of the medication.  Advised to call if fevers/chills, erythema, induration, drainage, or persistent bleeding.  Images permanently stored and available for review in the ultrasound unit.  Impression: Technically successful ultrasound guided injection.  [id number]; 15 additional minutes spent for Therapeutic exercises as stated in above notes.  This included exercises focusing on stretching, strengthening, with significant focus on eccentric aspects.   Long term goals include an improvement in range of motion, strength, endurance as well as avoiding reinjury. Patient's frequency would include in 1-2 times a day, 3-5 times a week for a duration of 6-12 weeks. Shoulder Exercises that included:  Basic scapular stabilization to  include adduction and depression of scapula Scaption, focusing on proper movement and good control Internal and External rotation utilizing a theraband, with elbow tucked at side entire time Rows with theraband   Proper technique shown and discussed handout in great detail with ATC.  All questions were discussed and answered.      Impression and Recommendations:     This case required medical decision making of moderate complexity. The above documentation has been reviewed and is accurate and complete Dylan Saa, DO       Note: This dictation was prepared with Dragon dictation along with smaller phrase technology. Any transcriptional errors that result from this process are unintentional.

## 2018-08-09 ENCOUNTER — Ambulatory Visit: Payer: BC Managed Care – PPO | Admitting: Family Medicine

## 2019-05-08 ENCOUNTER — Encounter: Payer: Self-pay | Admitting: Emergency Medicine

## 2019-05-08 ENCOUNTER — Ambulatory Visit
Admission: EM | Admit: 2019-05-08 | Discharge: 2019-05-08 | Disposition: A | Discharge status: Home / Self Care | Payer: BC Managed Care – PPO | Attending: Emergency Medicine | Admitting: Emergency Medicine

## 2019-05-08 ENCOUNTER — Other Ambulatory Visit: Payer: Self-pay

## 2019-05-08 DIAGNOSIS — R5383 Other fatigue: Secondary | ICD-10-CM | POA: Diagnosis not present

## 2019-05-08 DIAGNOSIS — R030 Elevated blood-pressure reading, without diagnosis of hypertension: Secondary | ICD-10-CM

## 2019-05-08 DIAGNOSIS — U071 COVID-19: Secondary | ICD-10-CM | POA: Diagnosis not present

## 2019-05-08 DIAGNOSIS — R110 Nausea: Secondary | ICD-10-CM | POA: Diagnosis not present

## 2019-05-08 LAB — POC SARS CORONAVIRUS 2 AG -  ED: SARS Coronavirus 2 Ag: POSITIVE — AB

## 2019-05-08 NOTE — ED Triage Notes (Signed)
Patient in office today c/o nausea,dizziness sx started this morning. Also stated last was feeling bodyaches  OTC,Tylenol

## 2019-05-08 NOTE — ED Provider Notes (Signed)
[facility]    CSN: [id number] Arrival date & time: 05/08/19  1322      History   Chief Complaint Chief Complaint  Patient presents with  . Nausea  . Dizziness    HPI Dylan Lamingharles P Tetzlaff Montez HagemanJr. is a 38 y.o. male.   Patient presents with fatigue, nausea, dizziness, and body aches since yesterday.  He has attempted treatment at home with Tylenol.  He denies fever, chills, sore throat, cough, shortness of breath, vomiting, diarrhea, rash, or other symptoms.  The history is provided by the patient.    History reviewed. No pertinent past medical history.  Patient Active Problem List   Diagnosis Date Noted  . Acute bursitis of right shoulder 07/12/2018  . Left leg cellulitis 12/02/2016  . Left leg swelling 12/02/2016  . Migraine without aura and without status migrainosus, not intractable 07/02/2016  . Onychia and paronychia of toe 05/31/2013  . Ingrown nail 05/31/2013    No past surgical history on file.     Home Medications    Prior to Admission medications   Medication Sig Start Date End Date Taking? Authorizing Provider  gabapentin (NEURONTIN) 100 MG capsule Take 2 capsules (200 mg total) by mouth at bedtime. 07/12/18   Judi SaaSmith, Zachary M, DO  rizatriptan (MAXALT) 10 MG tablet Take 1 tablet by mouth at the onset of headache and may repeat in 2 hours if needed 09/24/17   Olive BassMurray, Laura Woodruff, FNP  topiramate (TOPAMAX) 25 MG tablet Take one tablet before bed for 7 days and then increase to one tablet two times daily 06/02/18   Roddie McKordsmeier, Julia, FNP    Family History Family History  Problem Relation Age of Onset  . Hypertension Mother   . Gout Father     Social History Social History   Tobacco Use  . Smoking status: Never Smoker  . Smokeless tobacco: Never Used  Substance Use Topics  . Alcohol use: Yes    Comment: Socially  . Drug use: No     Allergies   Patient has no known allergies.   Review of Systems Review of Systems  Constitutional:  Positive for fatigue. Negative for chills and fever.  HENT: Negative for ear pain and sore throat.   Eyes: Negative for pain and visual disturbance.  Respiratory: Negative for cough and shortness of breath.   Cardiovascular: Negative for chest pain and palpitations.  Gastrointestinal: Positive for nausea. Negative for abdominal pain, diarrhea and vomiting.  Genitourinary: Negative for dysuria and hematuria.  Musculoskeletal: Negative for arthralgias and back pain.  Skin: Negative for color change and rash.  Neurological: Positive for dizziness. Negative for seizures and syncope.  All other systems reviewed and are negative.    Physical Exam Triage Vital Signs ED Triage Vitals [05/08/19 1328]  Enc Vitals Group     BP (!) 140/96     Pulse Rate (!) 105     Resp 18     Temp 98.8 F (37.1 C)     Temp Source Oral     SpO2 96 %     Weight      Height      Head Circumference      Peak Flow      Pain Score      Pain Loc      Pain Edu?      Excl. in GC?    No data found.  Updated Vital Signs BP (!) 140/96 (BP Location: Left Arm)   Pulse (!) 105  Temp 98.8 F (37.1 C) (Oral)   Resp 18   Wt 230 lb (104.3 kg)   SpO2 96%   BMI 37.12 kg/m   Visual Acuity Right Eye Distance:   Left Eye Distance:   Bilateral Distance:    Right Eye Near:   Left Eye Near:    Bilateral Near:     Physical Exam Vitals and nursing note reviewed.  Constitutional:      General: He is not in acute distress.    Appearance: He is well-developed. He is not ill-appearing.  HENT:     Head: Normocephalic and atraumatic.     Right Ear: Tympanic membrane normal.     Left Ear: Tympanic membrane normal.     Nose: Nose normal.     Mouth/Throat:     Mouth: Mucous membranes are moist.     Pharynx: Oropharynx is clear.  Eyes:     Conjunctiva/sclera: Conjunctivae normal.  Cardiovascular:     Rate and Rhythm: Normal rate and regular rhythm.     Heart sounds: No murmur.  Pulmonary:     Effort:  Pulmonary effort is normal. No respiratory distress.     Breath sounds: Normal breath sounds.  Abdominal:     General: Bowel sounds are normal.     Palpations: Abdomen is soft.     Tenderness: There is no abdominal tenderness. There is no guarding or rebound.  Musculoskeletal:     Cervical back: Neck supple.  Skin:    General: Skin is warm and dry.     Findings: No rash.  Neurological:     General: No focal deficit present.     Mental Status: He is alert and oriented to person, place, and time.  Psychiatric:        Mood and Affect: Mood normal.        Behavior: Behavior normal.      UC Treatments / Results  Labs (all labs ordered are listed, but only abnormal results are displayed) Labs Reviewed  POC SARS CORONAVIRUS 2 AG -  ED - Abnormal; Notable for the following components:      Result Value   SARS Coronavirus 2 Ag Positive (*)    All other components within normal limits    EKG   Radiology No results found.  Procedures Procedures (including critical care time)  Medications Ordered in UC Medications - No data to display  Initial Impression / Assessment and Plan / UC Course  I have reviewed the triage vital signs and the nursing notes.  Pertinent labs & imaging results that were available during my care of the patient were reviewed by me and considered in my medical decision making (see chart for details).    COVID-19.  POC COVID test positive.  Elevated blood pressure reading.  Instructed patient to take Tylenol as needed for fever or discomfort.  Instructed him to self quarantine per CDC guidelines.  Instructed him to go to the emergency department if he has high fever not relieved by Tylenol, shortness of breath, severe diarrhea, or other concerning symptoms.  Discussed with patient that his blood pressure is elevated and needs to be rechecked in 3 to 4 weeks.  Patient agrees to this plan of care.     Final Clinical Impressions(s) / UC Diagnoses   Final  diagnoses:  Nausea without vomiting  COVID-19  Elevated blood pressure reading     Discharge Instructions     Your COVID test is positive.    You should  self-quarantine for:  *10 days since your symptoms first appeared and  *24 hours with no fever, without the use of fever-reducing medications and  *your other symptoms of COVID are improving.  Most people do not need to be re-tested at the end of the quarantine period.    Go to the emergency department if you have high fever not relieved by Tylenol, shortness of breath, severe diarrhea, or other concerning symptoms.    Your blood pressure is elevated today at 140/96.  Please have this rechecked by your primary care provider in 3-4 weeks.       ED Prescriptions    None     PDMP not reviewed this encounter.   Mickie Bail, NP 05/08/19 [phone]

## 2019-05-08 NOTE — Discharge Instructions (Signed)
Your COVID test is positive.    You should self-quarantine for:  *10 days since your symptoms first appeared and  *24 hours with no fever, without the use of fever-reducing medications and  *your other symptoms of COVID are improving.  Most people do not need to be re-tested at the end of the quarantine period.    Go to the emergency department if you have high fever not relieved by Tylenol, shortness of breath, severe diarrhea, or other concerning symptoms.    Your blood pressure is elevated today at 140/96.  Please have this rechecked by your primary care provider in 3-4 weeks.

## 2019-05-09 ENCOUNTER — Telehealth: Payer: Self-pay | Admitting: Unknown Physician Specialty

## 2019-05-09 NOTE — Telephone Encounter (Signed)
Called to discuss with patient about Covid symptoms and the use of bamlanivimab, a monoclonal antibody infusion for those with mild to moderate Covid symptoms and at a high risk of hospitalization.  Pt is qualified for this infusion at the [facility] infusion center due to BMI>35   Message left to call back  Symptom onset 12/28

## 2019-06-08 ENCOUNTER — Ambulatory Visit: Payer: BC Managed Care – PPO | Attending: Internal Medicine

## 2019-06-08 DIAGNOSIS — Z20822 Contact with and (suspected) exposure to covid-19: Secondary | ICD-10-CM

## 2019-06-09 LAB — NOVEL CORONAVIRUS, NAA: SARS-CoV-2, NAA: NOT DETECTED

## 2019-10-22 ENCOUNTER — Other Ambulatory Visit: Payer: Self-pay

## 2019-10-22 ENCOUNTER — Encounter (HOSPITAL_COMMUNITY): Payer: Self-pay | Admitting: Emergency Medicine

## 2019-10-22 ENCOUNTER — Ambulatory Visit (HOSPITAL_COMMUNITY)
Admission: EM | Admit: 2019-10-22 | Discharge: 2019-10-22 | Disposition: A | Discharge status: Home / Self Care | Payer: BC Managed Care – PPO | Attending: Internal Medicine | Admitting: Internal Medicine

## 2019-10-22 DIAGNOSIS — S6010XA Contusion of unspecified finger with damage to nail, initial encounter: Secondary | ICD-10-CM | POA: Diagnosis not present

## 2019-10-22 NOTE — ED Triage Notes (Signed)
Pt c/o right pointer finger injury on Friday. Pt states he accidentally shut it in a window latch while closing a window. Nail is black and he states it is painful and tender to the touch.

## 2019-10-22 NOTE — Discharge Instructions (Addendum)
Change bandage as needed as wound drains.  Monitor for any signs or symptoms of infection such as increased swelling, drainage.  Return to urgent care if this should occur

## 2019-10-22 NOTE — ED Provider Notes (Signed)
[facility]    CSN: [id number] Arrival date & time: 10/22/19  1726   History   Chief Complaint Chief Complaint  Patient presents with  . Finger Injury    HPI Dylan Thornton. is a 39 y.o. male otherwise healthy presents with injury to left index finger . Pt states Dylan Thornton injured finger on window lock 2 days prior. Now with discoloration of nailbed with surrounding pain and swelling. Pt describes pain as pulsating and constant in nature. No decreased ROM, numbness, tingling or recent fever.   History reviewed. No pertinent past medical history.  Patient Active Problem List   Diagnosis Date Noted  . Acute bursitis of right shoulder 07/12/2018  . Left leg cellulitis 12/02/2016  . Left leg swelling 12/02/2016  . Migraine without aura and without status migrainosus, not intractable 07/02/2016  . Onychia and paronychia of toe 05/31/2013  . Ingrown nail 05/31/2013    History reviewed. No pertinent surgical history.    Home Medications    Prior to Admission medications   Medication Sig Start Date End Date Taking? Authorizing Provider  gabapentin (NEURONTIN) 100 MG capsule Take 2 capsules (200 mg total) by mouth at bedtime. 07/12/18   Judi Saa, DO  rizatriptan (MAXALT) 10 MG tablet Take 1 tablet by mouth at the onset of headache and may repeat in 2 hours if needed 09/24/17   Olive Bass, FNP  topiramate (TOPAMAX) 25 MG tablet Take one tablet before bed for 7 days and then increase to one tablet two times daily 06/02/18   Roddie Mc, FNP    Family History Family History  Problem Relation Age of Onset  . Hypertension Mother   . Gout Father     Social History Social History   Tobacco Use  . Smoking status: Never Smoker  . Smokeless tobacco: Never Used  Substance Use Topics  . Alcohol use: Yes    Comment: Socially  . Drug use: No     Allergies   Patient has no known allergies.   Review of Systems As stated in hpi, otherwise  negative   Physical Exam Triage Vital Signs ED Triage Vitals [10/22/19 1749]  Enc Vitals Group     BP 129/86     Pulse Rate 95     Resp 14     Temp 98.6 F (37 C)     Temp Source Oral     SpO2 100 %     Weight      Height      Head Circumference      Peak Flow      Pain Score 10     Pain Loc      Pain Edu?      Excl. in GC?    No data found.  Updated Vital Signs BP 129/86 (BP Location: Left Arm)   Pulse 95   Temp 98.6 F (37 C) (Oral)   Resp 14   SpO2 100%   Physical Exam Vitals and nursing note reviewed.  Constitutional:      Appearance: Normal appearance.  Musculoskeletal:     Comments: Left index finger with dark discoloration of nailbed. Swelling and erythema to skin just surrounding nailbed with tenderness to palpation. No drainage.   Neurological:     Mental Status: Dylan Thornton is alert.     UC Treatments / Results  Labs (all labs ordered are listed, but only abnormal results are displayed) Labs Reviewed - No data to display  EKG  Radiology No results found.  Procedures Incision and Drainage  Date/Time: 10/22/2019 11:39 PM Performed by: Rudolpho Sevin, NP Authorized by: Rudolpho Sevin, NP   Consent:    Consent obtained:  Verbal   Consent given by:  Patient   Risks discussed:  Incomplete drainage   Alternatives discussed:  No treatment Location:    Type:  Hematoma   Location:  Upper extremity   Upper extremity location:  Finger   Finger location:  L index finger Pre-procedure details:    Procedure prep: alcohol wipe. Procedure type:    Complexity:  Simple Procedure details:    Incision type: cautery.   Incision depth:  Subungual   Drainage:  Bloody   Drainage amount:  Moderate   Wound treatment:  Wound left open Post-procedure details:    Patient tolerance of procedure:  Tolerated well, no immediate complications   (including critical care time)  Medications Ordered in UC Medications - No data to display  Initial Impression /  Assessment and Plan / UC Course  I have reviewed the triage vital signs and the nursing notes.  Pertinent labs & imaging results that were available during my care of the patient were reviewed by me and considered in my medical decision making (see chart for details).  Subungual hematoma -secondary to trauma -Cautery pen used to drain blood from under nailbed with subsequent improvement in pain -wound care discussed -monitor for any s/sx's of infection, f/u prn  Final Clinical Impressions(s) / UC Diagnoses   Final diagnoses:  Subungual hematoma of digit of hand, initial encounter     Discharge Instructions     Change bandage as needed as wound drains.  Monitor for any signs or symptoms of infection such as increased swelling, drainage.  Return to urgent care if this should occur   ED Prescriptions    None     PDMP not reviewed this encounter.   Rudolpho Sevin, NP 10/22/19 2344

## 2020-02-06 ENCOUNTER — Telehealth: Payer: Self-pay | Admitting: Family

## 2020-02-06 ENCOUNTER — Emergency Department (HOSPITAL_COMMUNITY): Payer: BC Managed Care – PPO

## 2020-02-06 ENCOUNTER — Emergency Department (HOSPITAL_COMMUNITY)
Admission: EM | Admit: 2020-02-06 | Discharge: 2020-02-06 | Disposition: A | Discharge status: Home / Self Care | Payer: BC Managed Care – PPO | Attending: Emergency Medicine | Admitting: Emergency Medicine

## 2020-02-06 ENCOUNTER — Encounter (HOSPITAL_COMMUNITY): Payer: Self-pay | Admitting: Pediatrics

## 2020-02-06 ENCOUNTER — Other Ambulatory Visit: Payer: Self-pay

## 2020-02-06 DIAGNOSIS — R519 Headache, unspecified: Secondary | ICD-10-CM | POA: Insufficient documentation

## 2020-02-06 DIAGNOSIS — Z5321 Procedure and treatment not carried out due to patient leaving prior to being seen by health care provider: Secondary | ICD-10-CM | POA: Insufficient documentation

## 2020-02-06 DIAGNOSIS — R200 Anesthesia of skin: Secondary | ICD-10-CM | POA: Diagnosis present

## 2020-02-06 LAB — COMPREHENSIVE METABOLIC PANEL
ALT: 19 U/L (ref 0–44)
AST: 20 U/L (ref 15–41)
Albumin: 4.4 g/dL (ref 3.5–5.0)
Alkaline Phosphatase: 63 U/L (ref 38–126)
Anion gap: 11 (ref 5–15)
BUN: 16 mg/dL (ref 6–20)
CO2: 23 mmol/L (ref 22–32)
Calcium: 9.2 mg/dL (ref 8.9–10.3)
Chloride: 104 mmol/L (ref 98–111)
Creatinine, Ser: 1.39 mg/dL — ABNORMAL HIGH (ref 0.61–1.24)
GFR calc Af Amer: >60 mL/min (ref >60)
GFR calc non Af Amer: >60 mL/min (ref >60)
Glucose, Bld: 95 mg/dL (ref 70–99)
Potassium: 3.9 mmol/L (ref 3.5–5.1)
Sodium: 138 mmol/L (ref 135–145)
Total Bilirubin: 0.9 mg/dL (ref 0.3–1.2)
Total Protein: 7.2 g/dL (ref 6.5–8.1)

## 2020-02-06 LAB — CBC WITH DIFFERENTIAL/PLATELET
Abs Immature Granulocytes: 0.01 10*3/uL (ref 0.00–0.07)
Basophils Absolute: 0 10*3/uL (ref 0.0–0.1)
Basophils Relative: 1 %
Eosinophils Absolute: 0.1 10*3/uL (ref 0.0–0.5)
Eosinophils Relative: 1 %
HCT: 46.7 % (ref 39.0–52.0)
Hemoglobin: 15.4 g/dL (ref 13.0–17.0)
Immature Granulocytes: 0 %
Lymphocytes Relative: 41 %
Lymphs Abs: 2.1 10*3/uL (ref 0.7–4.0)
MCH: 28.2 pg (ref 26.0–34.0)
MCHC: 33 g/dL (ref 30.0–36.0)
MCV: 85.4 fL (ref 80.0–100.0)
Monocytes Absolute: 0.5 10*3/uL (ref 0.1–1.0)
Monocytes Relative: 10 %
Neutro Abs: 2.5 10*3/uL (ref 1.7–7.7)
Neutrophils Relative %: 47 %
Platelets: 264 10*3/uL (ref 150–400)
RBC: 5.47 MIL/uL (ref 4.22–5.81)
RDW: 13.4 % (ref 11.5–15.5)
WBC: 5.2 10*3/uL (ref 4.0–10.5)
nRBC: 0 % (ref 0.0–0.2)

## 2020-02-06 MED ORDER — IBUPROFEN 400 MG PO TABS
400.0000 mg | ORAL_TABLET | Freq: Once | ORAL | Status: AC | PRN
  Administered 2020-02-06: 400 mg via ORAL
  Filled 2020-02-06: qty 1

## 2020-02-06 NOTE — ED Notes (Signed)
Pt states he is leaving °

## 2020-02-06 NOTE — ED Triage Notes (Signed)
C/O right shoulder tingling / numbness intermittent for a few months along w/ headache. Patient also described some incidence of losing grip strength randomly.

## 2020-02-06 NOTE — Telephone Encounter (Signed)
[facility] Report/Call : --Caller states he is having a tingling and burning sensation in his right arm. He is also having symptoms of a migraine 3(0-10) constant but varies in intensity Provider is Dr.Murray and office location was verified. He has had the numbness off and on for about the last week and migraine symptoms off and on for two Weeks.  Advised go to ED now.  I do not see patient has went to ED.

## 2020-02-07 ENCOUNTER — Encounter: Payer: Self-pay | Admitting: Family Medicine

## 2020-02-07 ENCOUNTER — Ambulatory Visit (INDEPENDENT_AMBULATORY_CARE_PROVIDER_SITE_OTHER): Payer: BC Managed Care – PPO | Admitting: Family Medicine

## 2020-02-07 VITALS — BP 110/70 | HR 87 | Temp 97.9°F | Wt 221.7 lb

## 2020-02-07 DIAGNOSIS — M79601 Pain in right arm: Secondary | ICD-10-CM

## 2020-02-07 DIAGNOSIS — R519 Headache, unspecified: Secondary | ICD-10-CM

## 2020-02-07 MED ORDER — NORTRIPTYLINE HCL 10 MG PO CAPS: ORAL_CAPSULE | ORAL | 1 refills | Status: DC

## 2020-02-07 NOTE — Patient Instructions (Signed)
Try the Nortriptyline for headache prevention.  May increase to two at night if headache not relieved with one.  May need to consider cervical (neck) imaging if arm pain not improving.

## 2020-02-07 NOTE — Progress Notes (Signed)
Established Patient Office Visit  Subjective:  Patient ID: Dylan Thornton., male    DOB: 25-Feb-1981  Age: 39 y.o. MRN: [id number]  CC:  Chief Complaint  Patient presents with  . Headache    frontal lobe for about 2 weeks, almost migraine last 2 days  . Tingling    right arm, started about 2 weeks ago  . Arm Pain    right arm, started about 2 weeks ago    HPI [employer] Thornton. presents for 2-week history of bifrontal headache.  He has some question of migraines previously but is not sure if this has been formally diagnosed.  At 1 point he did take Maxalt and was prescribed Topamax for prevention but not taking.  He relates headache which is intermittent past 2 weeks and bifrontal.  No radiation.  No associated nausea or vomiting.  No photophobia.  No consistent throbbing quality.  No neck pain.  Also relates occasional sharp pains right arm and forearm.  No numbness or weakness.  No recent injury.  Sleeping okay.  Went to ER yesterday.  He had CT head which showed no acute findings.  CBC and comprehensive metabolic panel stable with exception of slightly elevated creatinine 1.39.  His baseline appears to be around 1.1.  He was not sure if he was well-hydrated at the time.  He has taken some Tylenol intermittently  Has any recent fever.  No sinusitis symptoms.  No exertional headaches.  History reviewed. No pertinent past medical history.  History reviewed. No pertinent surgical history.  Family History  Problem Relation Age of Onset  . Hypertension Mother   . Gout Father     Social History   Socioeconomic History  . Marital status: Married    Spouse name: Not on file  . Number of children: 0  . Years of education: 31  . Highest education level: Not on file  Occupational History  . Occupation: Sport and exercise psychologist  Tobacco Use  . Smoking status: Never Smoker  . Smokeless tobacco: Never Used  Substance and Sexual Activity  . Alcohol use: Yes    Comment:  Socially  . Drug use: No  . Sexual activity: Not on file  Other Topics Concern  . Not on file  Social History Narrative   Fun: Sleep    Social Determinants of Health   Financial Resource Strain:   . Difficulty of Paying Living Expenses: Not on file  Food Insecurity:   . Worried About Programme researcher, broadcasting/film/video in the Last Year: Not on file  . Ran Out of Food in the Last Year: Not on file  Transportation Needs:   . Lack of Transportation (Medical): Not on file  . Lack of Transportation (Non-Medical): Not on file  Physical Activity:   . Days of Exercise per Week: Not on file  . Minutes of Exercise per Session: Not on file  Stress:   . Feeling of Stress : Not on file  Social Connections:   . Frequency of Communication with Friends and Family: Not on file  . Frequency of Social Gatherings with Friends and Family: Not on file  . Attends Religious Services: Not on file  . Active Member of Clubs or Organizations: Not on file  . Attends Banker Meetings: Not on file  . Marital Status: Not on file  Intimate Partner Violence:   . Fear of Current or Ex-Partner: Not on file  . Emotionally Abused: Not on file  . Physically  Abused: Not on file  . Sexually Abused: Not on file    Outpatient Medications Prior to Visit  Medication Sig Dispense Refill  . rizatriptan (MAXALT) 10 MG tablet Take 1 tablet by mouth at the onset of headache and may repeat in 2 hours if needed 10 tablet 1  . gabapentin (NEURONTIN) 100 MG capsule Take 2 capsules (200 mg total) by mouth at bedtime. (Patient not taking: Reported on 02/07/2020) 60 capsule 3  . topiramate (TOPAMAX) 25 MG tablet Take one tablet before bed for 7 days and then increase to one tablet two times daily (Patient not taking: Reported on 02/07/2020) 30 tablet 1   No facility-administered medications prior to visit.    No Known Allergies  ROS Review of Systems  Constitutional: Negative for chills, fatigue and fever.  HENT: Negative for  sinus pressure and sinus pain.   Eyes: Negative for visual disturbance.  Respiratory: Negative for shortness of breath.   Musculoskeletal: Negative for neck pain.  Neurological: Positive for headaches. Negative for dizziness, seizures, syncope, speech difficulty, weakness and numbness.      Objective:    Physical Exam Vitals reviewed.  Constitutional:      Appearance: He is well-developed.  Cardiovascular:     Rate and Rhythm: Normal rate and regular rhythm.  Pulmonary:     Effort: Pulmonary effort is normal.     Breath sounds: Normal breath sounds.  Neurological:     Mental Status: He is alert.     Cranial Nerves: No cranial nerve deficit or facial asymmetry.     Motor: No weakness.     Coordination: Coordination normal.     Gait: Gait normal.  Psychiatric:        Mood and Affect: Mood normal.        Speech: Speech normal.        Behavior: Behavior normal.     BP 110/70   Pulse 87   Temp 97.9 F (36.6 C) (Oral)   Wt 221 lb 11.2 oz (100.6 kg)   SpO2 97%   BMI 35.78 kg/m  Wt Readings from Last 3 Encounters:  02/07/20 221 lb 11.2 oz (100.6 kg)  02/06/20 215 lb (97.5 kg)  05/08/19 230 lb (104.3 kg)     Health Maintenance Due  Topic Date Due  . Hepatitis C Screening  Never done  . COVID-19 Vaccine (1) Never done  . INFLUENZA VACCINE  Never done    There are no preventive care reminders to display for this patient.  No results found for: TSH Lab Results  Component Value Date   WBC 5.2 02/06/2020   HGB 15.4 02/06/2020   HCT 46.7 02/06/2020   MCV 85.4 02/06/2020   PLT 264 02/06/2020   Lab Results  Component Value Date   NA 138 02/06/2020   K 3.9 02/06/2020   CO2 23 02/06/2020   GLUCOSE 95 02/06/2020   BUN 16 02/06/2020   CREATININE 1.39 (H) 02/06/2020   BILITOT 0.9 02/06/2020   ALKPHOS 63 02/06/2020   AST 20 02/06/2020   ALT 19 02/06/2020   PROT 7.2 02/06/2020   ALBUMIN 4.4 02/06/2020   CALCIUM 9.2 02/06/2020   ANIONGAP 11 02/06/2020   GFR  92.38 09/24/2017   Lab Results  Component Value Date   CHOL 174 09/24/2017   Lab Results  Component Value Date   HDL 37.40 (L) 09/24/2017   Lab Results  Component Value Date   LDLCALC 119 (H) 09/24/2017   Lab Results  Component  Value Date   TRIG 88.0 09/24/2017   Lab Results  Component Value Date   CHOLHDL 5 09/24/2017   No results found for: HGBA1C    Assessment & Plan:   Patient presents with bifrontal headaches almost daily for the past couple weeks.  May have some overlap with tension type headache and migraine.  He does not describe any nausea, vomiting, photophobia, or worsening with activity.  Recent ER evaluation with CT and labs reviewed and basically unremarkable  -Recommend avoid daily use of analgesics -Consider trial of nortriptyline 10 mg nightly and may increase to 20 mg nightly if headache not improving in a few days.  If no relief with the above after titration to 20 mg recommend follow-up with primary. -regarding his intermittent right arm pain do not think this is related to his headache necessarily.  May need cervical neck imaging if not improving  Meds ordered this encounter  Medications  . nortriptyline (PAMELOR) 10 MG capsule    Sig: Take one to two po qhs prn headache prevention    Dispense:  60 capsule    Refill:  1    Follow-up: No follow-ups on file.    Evelena Peat, MD

## 2020-02-27 ENCOUNTER — Other Ambulatory Visit: Payer: Self-pay

## 2020-02-27 ENCOUNTER — Encounter: Payer: Self-pay | Admitting: Family Medicine

## 2020-02-27 ENCOUNTER — Ambulatory Visit (INDEPENDENT_AMBULATORY_CARE_PROVIDER_SITE_OTHER): Payer: BC Managed Care – PPO

## 2020-02-27 ENCOUNTER — Ambulatory Visit (INDEPENDENT_AMBULATORY_CARE_PROVIDER_SITE_OTHER): Payer: BC Managed Care – PPO | Admitting: Family Medicine

## 2020-02-27 VITALS — BP 110/72 | HR 95 | Ht 66.0 in | Wt 223.7 lb

## 2020-02-27 DIAGNOSIS — M5412 Radiculopathy, cervical region: Secondary | ICD-10-CM | POA: Diagnosis not present

## 2020-02-27 DIAGNOSIS — R519 Headache, unspecified: Secondary | ICD-10-CM | POA: Diagnosis not present

## 2020-02-27 NOTE — Progress Notes (Signed)
Established Patient Office Visit  Subjective:  Patient ID: Dylan Thornton., male    DOB: [birth year]  Age: 39 y.o. MRN: [id number]  CC:  Chief Complaint  Patient presents with  . Numbness    numbness in arm right , tingling, slightly headache    HPI Dylan Thornton. presents for follow-up regarding recent headaches and right upper extremity pain.  Refer to note from 02/07/2020 for details  Dylan Thornton. presents for 2-week history of bifrontal headache.  He has some question of migraines previously but is not sure if this has been formally diagnosed.  At 1 point he did take Maxalt and was prescribed Topamax for prevention but not taking.  He relates headache which is intermittent past 2 weeks and bifrontal.  No radiation.  No associated nausea or vomiting.  No photophobia.  No consistent throbbing quality.  No neck pain.  Also relates occasional sharp pains right arm and forearm.  No numbness or weakness.  No recent injury.  Sleeping okay.  Went to ER yesterday.  He had CT head which showed no acute findings.  CBC and comprehensive metabolic panel stable with exception of slightly elevated creatinine 1.39.  His baseline appears to be around 1.1.  He was not sure if he was well-hydrated at the time.  He has taken some Tylenol intermittently  Has any recent fever.  No sinusitis symptoms.  No exertional headaches.  We started nortriptyline 10 mg at night.  He has not seen much improvement headaches.  He took 20 mg only 1 time.  We had instructed to titrate to 20 mg if not seeing relief in a few days with 10 mg.  His headaches are essentially unchanged.  His main complaint today is some persistent radiation of pain from his right anterior shoulder down all the way to the hand.  He thinks he may have had some mild weakness.  We had discussed cervical neck imaging if not improved.  No significant numbness.  No relation of symptoms to neck movement.   History reviewed. No  pertinent past medical history.  History reviewed. No pertinent surgical history.  Family History  Problem Relation Age of Onset  . Hypertension Mother   . Gout Father     Social History   Socioeconomic History  . Marital status: Married    Spouse name: Not on file  . Number of children: 0  . Years of education: 67  . Highest education level: Not on file  Occupational History  . Occupation: Sport and exercise psychologist  Tobacco Use  . Smoking status: Never Smoker  . Smokeless tobacco: Never Used  Substance and Sexual Activity  . Alcohol use: Yes    Comment: Socially  . Drug use: No  . Sexual activity: Not on file  Other Topics Concern  . Not on file  Social History Narrative   Fun: Sleep    Social Determinants of Health   Financial Resource Strain:   . Difficulty of Paying Living Expenses: Not on file  Food Insecurity:   . Worried About Programme researcher, broadcasting/film/video in the Last Year: Not on file  . Ran Out of Food in the Last Year: Not on file  Transportation Needs:   . Lack of Transportation (Medical): Not on file  . Lack of Transportation (Non-Medical): Not on file  Physical Activity:   . Days of Exercise per Week: Not on file  . Minutes of Exercise per Session: Not on file  Stress:   .  Feeling of Stress : Not on file  Social Connections:   . Frequency of Communication with Friends and Family: Not on file  . Frequency of Social Gatherings with Friends and Family: Not on file  . Attends Religious Services: Not on file  . Active Member of Clubs or Organizations: Not on file  . Attends Banker Meetings: Not on file  . Marital Status: Not on file  Intimate Partner Violence:   . Fear of Current or Ex-Partner: Not on file  . Emotionally Abused: Not on file  . Physically Abused: Not on file  . Sexually Abused: Not on file    Outpatient Medications Prior to Visit  Medication Sig Dispense Refill  . gabapentin (NEURONTIN) 100 MG capsule Take 2 capsules (200 mg  total) by mouth at bedtime. 60 capsule 3  . nortriptyline (PAMELOR) 10 MG capsule Take one to two po qhs prn headache prevention (Patient not taking: Reported on 02/27/2020) 60 capsule 1  . rizatriptan (MAXALT) 10 MG tablet Take 1 tablet by mouth at the onset of headache and may repeat in 2 hours if needed (Patient not taking: Reported on 02/27/2020) 10 tablet 1  . topiramate (TOPAMAX) 25 MG tablet Take one tablet before bed for 7 days and then increase to one tablet two times daily (Patient not taking: Reported on 02/07/2020) 30 tablet 1   No facility-administered medications prior to visit.    No Known Allergies  ROS Review of Systems  Constitutional: Negative for appetite change, chills, fever and unexpected weight change.  Respiratory: Negative for cough and shortness of breath.   Cardiovascular: Negative for chest pain.  Musculoskeletal: Negative for neck pain.  Neurological: Positive for headaches. Negative for numbness.      Objective:    Physical Exam Vitals reviewed.  Constitutional:      Appearance: Normal appearance.  Neck:     Comments: Has good range of motion cervical spine.  No spinal tenderness.  He has no pain with lateral bending to the right or left Cardiovascular:     Pulses: Normal pulses.     Heart sounds: Normal heart sounds.  Pulmonary:     Effort: Pulmonary effort is normal.     Breath sounds: Normal breath sounds.  Musculoskeletal:     Comments: Good range of motion right shoulder.  No pain with abduction.  No localized tenderness around the shoulder region.  No evidence for any upper extremity muscle atrophy  Neurological:     Mental Status: He is alert.     Comments: Full strength upper extremities.  He has 2+ reflexes biceps and brachioradialis bilaterally     BP 110/72 (BP Location: Left Arm, Patient Position: Sitting, Cuff Size: Normal)   Pulse 95   Ht 5\' 6"  (1.676 m)   Wt 223 lb 11.2 oz (101.5 kg)   SpO2 96%   BMI 36.11 kg/m  Wt Readings  from Last 3 Encounters:  02/27/20 223 lb 11.2 oz (101.5 kg)  02/07/20 221 lb 11.2 oz (100.6 kg)  02/06/20 215 lb (97.5 kg)     Health Maintenance Due  Topic Date Due  . Hepatitis C Screening  Never done  . COVID-19 Vaccine (1) Never done  . INFLUENZA VACCINE  Never done    There are no preventive care reminders to display for this patient.  No results found for: TSH Lab Results  Component Value Date   WBC 5.2 02/06/2020   HGB 15.4 02/06/2020   HCT 46.7 02/06/2020  MCV 85.4 02/06/2020   PLT 264 02/06/2020   Lab Results  Component Value Date   NA 138 02/06/2020   K 3.9 02/06/2020   CO2 23 02/06/2020   GLUCOSE 95 02/06/2020   BUN 16 02/06/2020   CREATININE 1.39 (H) 02/06/2020   BILITOT 0.9 02/06/2020   ALKPHOS 63 02/06/2020   AST 20 02/06/2020   ALT 19 02/06/2020   PROT 7.2 02/06/2020   ALBUMIN 4.4 02/06/2020   CALCIUM 9.2 02/06/2020   ANIONGAP 11 02/06/2020   GFR 92.38 09/24/2017   Lab Results  Component Value Date   CHOL 174 09/24/2017   Lab Results  Component Value Date   HDL 37.40 (L) 09/24/2017   Lab Results  Component Value Date   LDLCALC 119 (H) 09/24/2017   Lab Results  Component Value Date   TRIG 88.0 09/24/2017   Lab Results  Component Value Date   CHOLHDL 5 09/24/2017   No results found for: HGBA1C    Assessment & Plan:   #1 right upper extremity pain.  Given radiation from the upper back in shoulder region all way to the hand consider right cervical radiculitis.  He does not have any focal weakness or numbness.  -Obtain cervical spine films.  If these are unremarkable consider possible referral to neurology for further evaluation  #2 persistent headaches.  Possible mixed type between migraine and tension. -Increase nortriptyline to 20 mg nightly.  If not improving with that consider neurology referral  No orders of the defined types were placed in this encounter.   Follow-up: No follow-ups on file.    Evelena Peat, MD

## 2020-02-29 ENCOUNTER — Encounter: Payer: Self-pay | Admitting: Neurology

## 2020-02-29 ENCOUNTER — Telehealth: Payer: Self-pay | Admitting: *Deleted

## 2020-02-29 NOTE — Telephone Encounter (Signed)
Patient returned call to the nurse about x-ray.

## 2020-02-29 NOTE — Addendum Note (Signed)
Addended by: Verlan Friends on: 02/29/2020 07:51 AM   Modules accepted: Orders

## 2020-02-29 NOTE — Telephone Encounter (Signed)
Pt was contacted about results. Please see results notes.

## 2020-04-23 ENCOUNTER — Other Ambulatory Visit: Payer: Self-pay | Admitting: Family Medicine

## 2020-05-06 NOTE — Progress Notes (Deleted)
NEUROLOGY CONSULTATION NOTE  Vernis P Barkley Boards. MRN: [id number] DOB: March 12, 1981  Referring provider: Evelena Peat, MD Primary care provider: Olive Bass, FNP  Reason for consult:  headaches   Subjective:  Dylan Thornton is a 39 year old ***-handed male who presents for headaches.  History supplemented by ED and referring provider's notes.  History of migraines since ***.  He previously was prescribed rizatriptan and topiramate but ***.  In September, he went to the ED where CT head personally reviewed was unremarkable.  He also reported right sided *** pain.  Denies numbness or weakness.  No preceding trauma.  He has been treated in the past for chronic right shoulder pain.  X-ray of cervical spine in October personally reviewed was normal.  He was taking nortriptyline 20mg  at bedtime but stopped as it was ineffective.  Current NSAIDS/analgesics:  *** Current triptans:  none Current ergotamine:  none Current anti-emetic:  none Current muscle relaxants:  none Current Antihypertensive medications:  none Current Antidepressant medications:  none Current Anticonvulsant medications:  none Current anti-CGRP:  none Current Vitamins/Herbal/Supplements:  none Current Antihistamines/Decongestants:  none Other therapy:  none Hormone/birth control:  none Other medications:  none  Past NSAIDS/analgesics:  *** Past abortive triptans:  Rizatriptan 10mg  Past abortive ergotamine:  none Past muscle relaxants:  Tizanidine 4mg  QHS Past anti-emetic:  none Past antihypertensive medications:  none Past antidepressant medications:  Nortriptyline 20mg  at bedtime Past anticonvulsant medications:  topiramate 25mg  BID, gabapentin 200mg  QHS Past anti-CGRP:  none Past vitamins/Herbal/Supplements:  none Past antihistamines/decongestants:  none Other past therapies:  ***  Caffeine:  *** Alcohol:  *** Smoker:  *** Diet:  *** Exercise:  *** Depression:  ***; Anxiety:  *** Other  pain:  *** Sleep hygiene:  *** Family history of headache:  ***      PAST MEDICAL HISTORY: No past medical history on file.  PAST SURGICAL HISTORY: No past surgical history on file.  MEDICATIONS: Current Outpatient Medications on File Prior to Visit  Medication Sig Dispense Refill  . gabapentin (NEURONTIN) 100 MG capsule Take 2 capsules (200 mg total) by mouth at bedtime. 60 capsule 3  . nortriptyline (PAMELOR) 10 MG capsule Take one to two po qhs prn headache prevention (Patient not taking: Reported on 02/27/2020) 60 capsule 1  . rizatriptan (MAXALT) 10 MG tablet Take 1 tablet by mouth at the onset of headache and may repeat in 2 hours if needed (Patient not taking: Reported on 02/27/2020) 10 tablet 1  . topiramate (TOPAMAX) 25 MG tablet Take one tablet before bed for 7 days and then increase to one tablet two times daily (Patient not taking: Reported on 02/07/2020) 30 tablet 1   No current facility-administered medications on file prior to visit.    ALLERGIES: No Known Allergies  FAMILY HISTORY: Family History  Problem Relation Age of Onset  . Hypertension Mother   . Gout Father    ***.  SOCIAL HISTORY: Social History   Socioeconomic History  . Marital status: Married    Spouse name: Not on file  . Number of children: 0  . Years of education: 79  . Highest education level: Not on file  Occupational History  . Occupation:  Tobacco Use  . Smoking status: Never Smoker  . Smokeless tobacco: Never Used  Substance and Sexual Activity  . Alcohol use: Yes    Comment: Socially  . Drug use: No  . Sexual activity: Not on file  Other Topics Concern  .  Not on file  Social History Narrative   Fun: Sleep    Social Determinants of Health   Financial Resource Strain: Not on file  Food Insecurity: Not on file  Transportation Needs: Not on file  Physical Activity: Not on file  Stress: Not on file  Social Connections: Not on file  Intimate  Partner Violence: Not on file    Objective:  *** General: No acute distress.  Patient appears well-groomed.   Head:  Normocephalic/atraumatic Eyes:  fundi examined but not visualized Neck: supple, no paraspinal tenderness, full range of motion Back: No paraspinal tenderness Heart: regular rate and rhythm Lungs: Clear to auscultation bilaterally. Vascular: No carotid bruits. Neurological Exam: Mental status: alert and oriented to person, place, and time, recent and remote memory intact, fund of knowledge intact, attention and concentration intact, speech fluent and not dysarthric, language intact. Cranial nerves: CN I: not tested CN II: pupils equal, round and reactive to light, visual fields intact CN III, IV, VI:  full range of motion, no nystagmus, no ptosis CN V: facial sensation intact. CN VII: upper and lower face symmetric CN VIII: hearing intact CN IX, X: gag intact, uvula midline CN XI: sternocleidomastoid and trapezius muscles intact CN XII: tongue midline Bulk & Tone: normal, no fasciculations. Motor:  muscle strength 5/5 throughout Sensation:  Pinprick, temperature and vibratory sensation intact. Deep Tendon Reflexes:  2+ throughout,  toes downgoing.   Finger to nose testing:  Without dysmetria.   Heel to shin:  Without dysmetria.   Gait:  Normal station and stride.  Romberg negative.  Assessment/Plan:   ***    Thank you for allowing me to take part in the care of this patient.  Shon Millet, DO  CC:  Olive Bass, FNP  Evelena Peat, MD

## 2020-05-07 ENCOUNTER — Encounter: Payer: Self-pay | Admitting: Neurology

## 2020-05-07 ENCOUNTER — Ambulatory Visit: Payer: BC Managed Care – PPO | Admitting: Neurology

## 2020-05-07 DIAGNOSIS — Z029 Encounter for administrative examinations, unspecified: Secondary | ICD-10-CM

## 2020-06-05 ENCOUNTER — Ambulatory Visit: Payer: Self-pay

## 2020-06-05 ENCOUNTER — Other Ambulatory Visit: Payer: Self-pay

## 2020-06-05 ENCOUNTER — Ambulatory Visit (INDEPENDENT_AMBULATORY_CARE_PROVIDER_SITE_OTHER): Payer: BC Managed Care – PPO

## 2020-06-05 ENCOUNTER — Ambulatory Visit: Payer: BC Managed Care – PPO | Admitting: Family Medicine

## 2020-06-05 ENCOUNTER — Encounter: Payer: Self-pay | Admitting: Family Medicine

## 2020-06-05 VITALS — BP 122/90 | HR 76 | Ht 66.0 in | Wt 217.0 lb

## 2020-06-05 DIAGNOSIS — M25511 Pain in right shoulder: Secondary | ICD-10-CM | POA: Diagnosis not present

## 2020-06-05 DIAGNOSIS — M5010 Cervical disc disorder with radiculopathy, unspecified cervical region: Secondary | ICD-10-CM | POA: Diagnosis not present

## 2020-06-05 DIAGNOSIS — M2550 Pain in unspecified joint: Secondary | ICD-10-CM

## 2020-06-05 DIAGNOSIS — M545 Low back pain, unspecified: Secondary | ICD-10-CM

## 2020-06-05 DIAGNOSIS — G8929 Other chronic pain: Secondary | ICD-10-CM | POA: Diagnosis not present

## 2020-06-05 DIAGNOSIS — M7551 Bursitis of right shoulder: Secondary | ICD-10-CM | POA: Diagnosis not present

## 2020-06-05 LAB — COMPREHENSIVE METABOLIC PANEL
ALT: 17 U/L (ref 0–53)
AST: 19 U/L (ref 0–37)
Albumin: 4.9 g/dL (ref 3.5–5.2)
Alkaline Phosphatase: 90 U/L (ref 39–117)
BUN: 15 mg/dL (ref 6–23)
CO2: 32 mEq/L (ref 19–32)
Calcium: 10.2 mg/dL (ref 8.4–10.5)
Chloride: 102 mEq/L (ref 96–112)
Creatinine, Ser: 1.48 mg/dL (ref 0.40–1.50)
GFR: 60.17 mL/min (ref >60.00)
Glucose, Bld: 68 mg/dL — ABNORMAL LOW (ref 70–99)
Potassium: 4.4 mEq/L (ref 3.5–5.1)
Sodium: 139 mEq/L (ref 135–145)
Total Bilirubin: 0.7 mg/dL (ref 0.2–1.2)
Total Protein: 7.9 g/dL (ref 6.0–8.3)

## 2020-06-05 LAB — CBC WITH DIFFERENTIAL/PLATELET
Basophils Absolute: 0 10*3/uL (ref 0.0–0.1)
Basophils Relative: 0.4 % (ref 0.0–3.0)
Eosinophils Absolute: 0 10*3/uL (ref 0.0–0.7)
Eosinophils Relative: 0.1 % (ref 0.0–5.0)
HCT: 47.1 % (ref 39.0–52.0)
Hemoglobin: 16 g/dL (ref 13.0–17.0)
Lymphocytes Relative: 13.8 % (ref 12.0–46.0)
Lymphs Abs: 1 10*3/uL (ref 0.7–4.0)
MCHC: 33.9 g/dL (ref 30.0–36.0)
MCV: 83.6 fl (ref 78.0–100.0)
Monocytes Absolute: 0.7 10*3/uL (ref 0.1–1.0)
Monocytes Relative: 9.6 % (ref 3.0–12.0)
Neutro Abs: 5.2 10*3/uL (ref 1.4–7.7)
Neutrophils Relative %: 76.1 % (ref 43.0–77.0)
Platelets: 262 10*3/uL (ref 150.0–400.0)
RBC: 5.63 Mil/uL (ref 4.22–5.81)
RDW: 14 % (ref 11.5–15.5)
WBC: 6.9 10*3/uL (ref 4.0–10.5)

## 2020-06-05 LAB — SEDIMENTATION RATE: Sed Rate: 29 mm/hr — ABNORMAL HIGH (ref 0–15)

## 2020-06-05 LAB — FERRITIN: Ferritin: 137.2 ng/mL (ref 22.0–322.0)

## 2020-06-05 LAB — C-REACTIVE PROTEIN: CRP: <1 mg/dL (ref 0.5–20.0)

## 2020-06-05 LAB — IBC PANEL
Iron: 76 ug/dL (ref 42–165)
Saturation Ratios: 21.9 % (ref 20.0–50.0)
Transferrin: 248 mg/dL (ref 212.0–360.0)

## 2020-06-05 LAB — TSH: TSH: 0.98 u[IU]/mL (ref 0.35–4.50)

## 2020-06-05 LAB — URIC ACID: Uric Acid, Serum: 6.9 mg/dL (ref 4.0–7.8)

## 2020-06-05 LAB — VITAMIN D 25 HYDROXY (VIT D DEFICIENCY, FRACTURES): VITD: 32.25 ng/mL (ref 30.00–100.00)

## 2020-06-05 MED ORDER — PREDNISONE 20 MG PO TABS
40.0000 mg | ORAL_TABLET | Freq: Every day | ORAL | 0 refills | Status: DC
Start: 2020-06-05 — End: 2021-02-18

## 2020-06-05 NOTE — Assessment & Plan Note (Signed)
Patient is having signs and symptoms consistent with more of a cervical radiculopathy.  Patient does have weakness in the C6 and C8 distribution.  This is on the right side.  No significant thenar eminence wasting.  I do believe that an MRI is necessary at this time.  Physical exam seems to be significantly worsening since patient has seen by his primary care provider.  Follow-up with me again after imaging to discuss.

## 2020-06-05 NOTE — Assessment & Plan Note (Signed)
Patient is having more of the polyarthralgia.  Concern with some of the weakness.  Laboratory work-up ordered today.

## 2020-06-05 NOTE — Assessment & Plan Note (Signed)
Patient is having some low back pain.  Seems to be diffuse.  X-rays are still pending.  We will get laboratory work-up secondary to the what appears to be systemic difficulties that patient is having.  More concerned secondary to the right upper extremity anything else at the moment.  We will do a short course of prednisone.  Follow-up with me again 2 to 4 weeks

## 2020-06-05 NOTE — Progress Notes (Signed)
[name] [address] [place] [address] Phone: [phone] Subjective:   I Dylan Thornton am serving as a Neurosurgeon for Dr. Antoine Primas.  This visit occurred during the SARS-CoV-2 public health emergency.  Safety protocols were in place, including screening questions prior to the visit, additional usage of staff PPE, and extensive cleaning of exam room while observing appropriate contact time as indicated for disinfecting solutions.   I'm seeing this patient by the request  of:  Olive Bass, FNP  CC: Right shoulder pain  BOF:[id number]  Dylan Thornton. is a 40 y.o. male coming in with complaint of right shoulder pain. Seen in 2020 for shoulder bursitis, R. Patient states the pain is chronic. States it is the same issue from 07/12/2018. Believes he has gotten worse. States there is a tingle sensation that radiates to his hands. Hard to carry has newborn baby due to the pain. Weakness and limited ROM. Extension ROM is very limited. Right hand dominant. States that at work he has pain after a while. States he has neck and back pain. Has tried multiple modalities for pain. States injections help for a long period of time. 5-10/10 at its worse. Pain is consistent. 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 Past few nights he has had trouble sleeping.   Patient has seen his primary care physician fairly recently for right upper extremity pain and weakness.  Patient was having more of a right sided cervical radiculitis but no significant focal weakness or numbness.  That office visit was in October.  Patient does states now he is having more of the weakness of the upper extremity.  Only seems to be on the right side.    Patient did have cervical x-rays done February 28, 2020.  These were negative for any type of bony abnormality.  No past medical history on file. No past surgical history on file. Social  History   Socioeconomic History  . Marital status: Married    Spouse name: Not on file  . Number of children: 0  . Years of education: 24  . Highest education level: Not on file  Occupational History  . Occupation: Sport and exercise psychologist  Tobacco Use  . Smoking status: Never Smoker  . Smokeless tobacco: Never Used  Substance and Sexual Activity  . Alcohol use: Yes    Comment: Socially  . Drug use: No  . Sexual activity: Not on file  Other Topics Concern  . Not on file  Social History Narrative   Fun: Sleep    Social Determinants of Health   Financial Resource Strain: Not on file  Food Insecurity: Not on file  Transportation Needs: Not on file  Physical Activity: Not on file  Stress: Not on file  Social Connections: Not on file   No Known Allergies Family History  Problem Relation Age of Onset  . Hypertension Mother   . Gout Father     Current Outpatient Medications (Endocrine & Metabolic):  .  predniSONE (DELTASONE) 20 MG tablet, Take 2 tablets (40 mg total) by mouth daily with breakfast.    Current Outpatient Medications (Analgesics):  .  rizatriptan (MAXALT) 10 MG tablet, Take 1 tablet by mouth at the onset of headache and may repeat in 2 hours if needed   Current Outpatient Medications (Other):  .  gabapentin (NEURONTIN) 100 MG capsule, Take 2 capsules (200 mg total) by mouth at bedtime. .  nortriptyline (PAMELOR)  10 MG capsule, Take one to two po qhs prn headache prevention .  topiramate (TOPAMAX) 25 MG tablet, Take one tablet before bed for 7 days and then increase to one tablet two times daily   Reviewed prior external information including notes and imaging from  primary care provider As well as notes that were available from care everywhere and other healthcare systems.  Past medical history, social, surgical and family history all reviewed in electronic medical record.  No pertanent information unless stated regarding to the chief complaint.    Review of Systems:  No headache, visual changes, nausea, vomiting, diarrhea, constipation, dizziness, abdominal pain, skin rash, fevers, chills, night sweats, weight loss, swollen lymph nodes, , joint swelling, chest pain, shortness of breath, mood changes. POSITIVE muscle aches, body aches  Objective  Blood pressure 122/90, pulse 76, height 5\' 6"  (1.676 m), weight 217 lb (98.4 kg), SpO2 98 %.   General: No apparent distress alert and oriented x3 mood and affect normal, dressed appropriately.  HEENT: Pupils equal, extraocular movements intact  Respiratory: Patient's speak in full sentences and does not appear short of breath  Cardiovascular: No lower extremity edema, non tender, no erythema  Gait normal with good balance and coordination.  MSK:  Neck exam shows the patient does have some mild loss of lordosis.  Some increasing discomfort with pain on the right side of the neck with Spurling's.  Patient though does not have true radicular symptoms when I do this.  Patient though does have significant weakness in the C6 and C8 distribution on the right compared to left.  No thenar eminence wasting noted.  Patient does have pain in the shoulder with movement.  Patient lacks internal range of motion and does seem to have some weakness with pushoff.  Rotator cuff though it does seem to be intact with empty can but patient is uncomfortable.  Mild impingement noted.  Limited musculoskeletal ultrasound was performed and interpreted by  Limited ultrasound of patient's right shoulder is actually fairly unremarkable.  Somewhat difficult to see secondary to patient's muscular deltoid.  Rotator cuff appears to be completely intact.  No significant signs of any glenohumeral narrowing.  Patient's acromioclavicular joint is also unremarkable. Impression: Relatively normal shoulder ultrasound mild difficulty secondary to patient's deltoid muscle.    Impression and Recommendations:     The  above documentation has been reviewed and is accurate and complete Judi Saa, DO

## 2020-06-05 NOTE — Patient Instructions (Addendum)
Good to see you MRI cervical ordered Back xray on the way out Labs on the way out Prednisone 40 mg for 5 days Continue other meds See me again in 3-4 weeks

## 2020-06-05 NOTE — Assessment & Plan Note (Signed)
Patient previously had a bursitis 2 years ago.  Patient on ultrasound does not have any significant findings of a another bursitis and rotator cuff appears to be unremarkable.  X-rays ordered today also appear to be unremarkable.  Think it is more secondary to his neck.  We will try prednisone see how patient responds.  He will continue to have difficulty we will consider the possibility of advanced imaging of the shoulder.

## 2020-06-06 ENCOUNTER — Other Ambulatory Visit: Payer: Self-pay

## 2020-06-06 ENCOUNTER — Encounter: Payer: Self-pay | Admitting: Family Medicine

## 2020-06-06 DIAGNOSIS — M25511 Pain in right shoulder: Secondary | ICD-10-CM

## 2020-06-06 DIAGNOSIS — G8929 Other chronic pain: Secondary | ICD-10-CM

## 2020-06-07 LAB — ANGIOTENSIN CONVERTING ENZYME: Angiotensin-Converting Enzyme: 27 U/L (ref 9–67)

## 2020-06-07 LAB — PTH, INTACT AND CALCIUM
Calcium: 10.1 mg/dL (ref 8.6–10.3)
PTH: 14 pg/mL (ref 14–64)

## 2020-06-07 LAB — CYCLIC CITRUL PEPTIDE ANTIBODY, IGG: Cyclic Citrullin Peptide Ab: <16 UNITS

## 2020-06-07 LAB — CALCIUM, IONIZED: Calcium, Ion: 5.39 mg/dL (ref 4.8–5.6)

## 2020-06-07 LAB — RHEUMATOID FACTOR: Rheumatoid fact SerPl-aCnc: <14 IU/mL (ref <14)

## 2020-06-07 LAB — ANA: Anti Nuclear Antibody (ANA): NEGATIVE

## 2020-06-10 LAB — TESTOSTERONE, FREE, TOTAL, SHBG
Sex Hormone Binding: 32.6 nmol/L (ref 16.5–55.9)
Testosterone, Free: 11.1 pg/mL (ref 8.7–25.1)
Testosterone: 539 ng/dL (ref 264–916)

## 2020-06-18 NOTE — Progress Notes (Signed)
[name] [address] [place] [address] Phone: [phone] Subjective:   Dylan Thornton, am serving as a scribe for Dr. Antoine Primas.  This visit occurred during the SARS-CoV-2 public health emergency.  Safety protocols were in place, including screening questions prior to the visit, additional usage of staff PPE, and extensive cleaning of exam room while observing appropriate contact time as indicated for disinfecting solutions.   I'm seeing this patient by the request  of:  Olive Bass, FNP  CC: Neck pain, arm pain and back pain follow-up  [id number]   06/05/2020 Patient is having more of the polyarthralgia.  Concern with some of the weakness.  Laboratory work-up ordered today.  Patient is having some low back pain.  Seems to be diffuse.  X-rays are still pending.  We will get laboratory work-up secondary to the what appears to be systemic difficulties that patient is having.  More concerned secondary to the right upper extremity anything else at the moment.  We will do a short course of prednisone.  Follow-up with me again 2 to 4 weeks  Patient previously had a bursitis 2 years ago.  Patient on ultrasound does not have any significant findings of a another bursitis and rotator cuff appears to be unremarkable.  X-rays ordered today also appear to be unremarkable.  Think it is more secondary to his neck.  We will try prednisone see how patient responds.  He will continue to have difficulty we will consider the possibility of advanced imaging of the shoulder.  Update 06/19/2020 Dylan Thornton. is a 40 y.o. male coming in with complaint of right shoulder and neck pain. Upcoming MRIs scheduled. Patient states still in a little pain in back and shoulder the numbness is less but still comes and goes.  Patient states that when he was on the prednisone it did help somewhat.  Is having worsening pain no at this moment.  Patient does have  his wife on the phone he states that unfortunately he is having some difficulty at [employer] as well.  Feels like the work could be making things a little bit worse.  Continues to have weakness on the right hand being the most concerning aspect.     MRI cervical it is scheduled for June 21, 2020 MRI of the shoulder is scheduled for July 06, 2018  History reviewed. No pertinent past medical history. History reviewed. No pertinent surgical history. Social History   Socioeconomic History  . Marital status: Married    Spouse name: Not on file  . Number of children: 0  . Years of education: 71  . Highest education level: Not on file  Occupational History  . Occupation: Sport and exercise psychologist  Tobacco Use  . Smoking status: Never Smoker  . Smokeless tobacco: Never Used  Substance and Sexual Activity  . Alcohol use: Yes    Comment: Socially  . Drug use: No  . Sexual activity: Not on file  Other Topics Concern  . Not on file  Social History Narrative   Fun: Sleep    Social Determinants of Health   Financial Resource Strain: Not on file  Food Insecurity: Not on file  Transportation Needs: Not on file  Physical Activity: Not on file  Stress: Not on file  Social Connections: Not on file   No Known Allergies Family History  Problem Relation Age of Onset  . Hypertension Mother   . Gout Father     Current  Outpatient Medications (Endocrine & Metabolic):  .  predniSONE (DELTASONE) 20 MG tablet, Take 2 tablets (40 mg total) by mouth daily with breakfast.    Current Outpatient Medications (Analgesics):  .  rizatriptan (MAXALT) 10 MG tablet, Take 1 tablet by mouth at the onset of headache and may repeat in 2 hours if needed   Current Outpatient Medications (Other):  .  gabapentin (NEURONTIN) 100 MG capsule, Take 2 capsules (200 mg total) by mouth at bedtime. .  nortriptyline (PAMELOR) 10 MG capsule, Take one to two po qhs prn headache prevention .  topiramate (TOPAMAX) 25  MG tablet, Take one tablet before bed for 7 days and then increase to one tablet two times daily   Reviewed prior external information including notes and imaging from  primary care provider As well as notes that were available from care everywhere and other healthcare systems.  Past medical history, social, surgical and family history all reviewed in electronic medical record.  No pertanent information unless stated regarding to the chief complaint.   Review of Systems:  No headache, visual changes, nausea, vomiting, diarrhea, constipation, dizziness, abdominal pain, skin rash, fevers, chills, night sweats, weight loss, swollen lymph nodes, body aches, joint swelling, chest pain, shortness of breath, mood changes. POSITIVE muscle aches  Objective  Blood pressure 122/82, pulse 81, height 5\' 6"  (1.676 m), weight 217 lb (98.4 kg), SpO2 97 %.   General: No apparent distress alert and oriented x3 mood and affect normal, dressed appropriately.  HEENT: Pupils equal, extraocular movements intact  Respiratory: Patient's speak in full sentences and does not appear short of breath  Cardiovascular: No lower extremity edema, non tender, no erythema  Gait normal with good balance and coordination.  MSK: Right shoulder exam shows the patient does have positive impingement noted.  Patient does have a rotator cuff strength though is intact.  Patient's neck though does have a positive Spurling's on the right side.  Continued weakness in the C6 and C7 distribution. Patient is low back exam tenderness to palpation of the paraspinal musculature on the left side.  Negative straight leg test, tightness with but no true increasing pain.  Neurovascular intact distally.  5/5 strength in lower extremities   Impression and Recommendations:     The above documentation has been reviewed and is accurate and complete Pearlean Brownie, DO

## 2020-06-19 ENCOUNTER — Other Ambulatory Visit: Payer: Self-pay

## 2020-06-19 ENCOUNTER — Encounter: Payer: Self-pay | Admitting: Family Medicine

## 2020-06-19 ENCOUNTER — Ambulatory Visit (INDEPENDENT_AMBULATORY_CARE_PROVIDER_SITE_OTHER): Payer: BC Managed Care – PPO | Admitting: Family Medicine

## 2020-06-19 DIAGNOSIS — M5010 Cervical disc disorder with radiculopathy, unspecified cervical region: Secondary | ICD-10-CM | POA: Diagnosis not present

## 2020-06-19 DIAGNOSIS — M545 Low back pain, unspecified: Secondary | ICD-10-CM | POA: Diagnosis not present

## 2020-06-19 DIAGNOSIS — G8929 Other chronic pain: Secondary | ICD-10-CM | POA: Diagnosis not present

## 2020-06-19 NOTE — Assessment & Plan Note (Signed)
Continues to have significant weakness in the C6 distribution.  At this point I do think that patient can increase his nortriptyline to 1-1/2 pills and see how patient responds.  Patient states that the prednisone did help with some of the pain but did not help with the weakness.  Patient's MRI of the cervical spine is scheduled in 2 days and we will further evaluate.  Patient does have a MRI of the shoulder at a later date.  Depending on imaging this will change her medical management.  Patient could be a candidate for epidurals.

## 2020-06-19 NOTE — Assessment & Plan Note (Signed)
With laboratory work-up being unremarkable and x-rays being negative this is reassuring and more likely muscular.  Will likely respond well to physical therapy if we are able to start it but at the moment still more pressing is the weakness of the upper extremity.  Patient will be put on a lifting restriction at this time.  Worsening symptoms will come back sooner but we will discuss with him after the imaging.

## 2020-06-19 NOTE — Patient Instructions (Addendum)
Good to see you  Continue nortriptyline at night okay to try 1 and a half pills if you need 10 pound lifting limit for the next month/letter for work given  We will be in touch once the MRI results are in

## 2020-06-21 ENCOUNTER — Ambulatory Visit
Admission: RE | Admit: 2020-06-21 | Discharge: 2020-06-21 | Disposition: A | Discharge status: Home / Self Care | Payer: BC Managed Care – PPO | Source: Ambulatory Visit | Attending: Family Medicine | Admitting: Family Medicine

## 2020-06-21 DIAGNOSIS — M2550 Pain in unspecified joint: Secondary | ICD-10-CM

## 2020-07-06 ENCOUNTER — Ambulatory Visit
Admission: RE | Admit: 2020-07-06 | Discharge: 2020-07-06 | Disposition: A | Discharge status: Home / Self Care | Payer: BC Managed Care – PPO | Source: Ambulatory Visit | Attending: Family Medicine | Admitting: Family Medicine

## 2020-07-06 ENCOUNTER — Other Ambulatory Visit: Payer: Self-pay

## 2020-07-06 DIAGNOSIS — G8929 Other chronic pain: Secondary | ICD-10-CM

## 2020-07-09 ENCOUNTER — Encounter: Payer: Self-pay | Admitting: Family Medicine

## 2020-07-11 ENCOUNTER — Other Ambulatory Visit: Payer: Self-pay

## 2020-07-11 DIAGNOSIS — G8929 Other chronic pain: Secondary | ICD-10-CM

## 2021-02-18 ENCOUNTER — Encounter: Payer: Self-pay | Admitting: Internal Medicine

## 2021-02-18 ENCOUNTER — Ambulatory Visit: Payer: BC Managed Care – PPO | Admitting: Internal Medicine

## 2021-02-18 ENCOUNTER — Other Ambulatory Visit: Payer: Self-pay

## 2021-02-18 DIAGNOSIS — R112 Nausea with vomiting, unspecified: Secondary | ICD-10-CM | POA: Diagnosis not present

## 2021-02-18 DIAGNOSIS — G43109 Migraine with aura, not intractable, without status migrainosus: Secondary | ICD-10-CM

## 2021-02-18 DIAGNOSIS — R0683 Snoring: Secondary | ICD-10-CM | POA: Insufficient documentation

## 2021-02-18 MED ORDER — VITAMIN D 25 MCG (1000 UNIT) PO TABS: 1000.0000 [IU] | ORAL_TABLET | Freq: Every day | ORAL | 11 refills | Status: AC

## 2021-02-18 MED ORDER — NAPROXEN 500 MG PO TABS: 500.0000 mg | ORAL_TABLET | Freq: Two times a day (BID) | ORAL | 2 refills | Status: DC | PRN

## 2021-02-18 MED ORDER — ONDANSETRON HCL 4 MG PO TABS
4.0000 mg | ORAL_TABLET | Freq: Three times a day (TID) | ORAL | 2 refills | Status: DC | PRN
Start: 2021-02-18 — End: 2022-07-20

## 2021-02-18 MED ORDER — SUMATRIPTAN SUCCINATE 100 MG PO TABS: 100.0000 mg | ORAL_TABLET | ORAL | 4 refills | Status: AC | PRN

## 2021-02-18 NOTE — Assessment & Plan Note (Signed)
Chronic - worse Tried Maxalt, Topamax, Pamelor CT head (-) 9/22

## 2021-02-18 NOTE — Assessment & Plan Note (Addendum)
R/o OSA - discussed with patient.  Sleep study suggested

## 2021-02-18 NOTE — Progress Notes (Signed)
Subjective:  Patient ID: Dylan Sorrow., male    DOB: 1980-07-19  Age: 40 y.o. MRN: [id number]  CC: Establish Care   HPI Dylan Thornton. presents for a new pt visit C/ severe HAs 2/week  since his 20's w/aura. 2/[facility] - disabling HAs. Using Tylenol ES.   Outpatient Medications Prior to Visit  Medication Sig Dispense Refill   gabapentin (NEURONTIN) 100 MG capsule Take 2 capsules (200 mg total) by mouth at bedtime. 60 capsule 3   nortriptyline (PAMELOR) 10 MG capsule Take one to two po qhs prn headache prevention 60 capsule 1   rizatriptan (MAXALT) 10 MG tablet Take 1 tablet by mouth at the onset of headache and may repeat in 2 hours if needed 10 tablet 1   topiramate (TOPAMAX) 25 MG tablet Take one tablet before bed for 7 days and then increase to one tablet two times daily 30 tablet 1   predniSONE (DELTASONE) 20 MG tablet Take 2 tablets (40 mg total) by mouth daily with breakfast. (Patient not taking: Reported on 02/18/2021) 10 tablet 0   No facility-administered medications prior to visit.    ROS: Review of Systems  Constitutional:  Negative for appetite change, fatigue and unexpected weight change.  HENT:  Negative for congestion, nosebleeds, sneezing, sore throat and trouble swallowing.   Eyes:  Negative for itching and visual disturbance.  Respiratory:  Negative for cough.   Cardiovascular:  Negative for chest pain, palpitations and leg swelling.  Gastrointestinal:  Negative for abdominal distention, blood in stool, diarrhea and nausea.  Genitourinary:  Negative for frequency and hematuria.  Musculoskeletal:  Negative for back pain, gait problem, joint swelling and neck pain.  Skin:  Negative for rash.  Neurological:  Positive for headaches. Negative for dizziness, tremors, speech difficulty and weakness.  Psychiatric/Behavioral:  Negative for agitation, dysphoric mood and sleep disturbance. The patient is not nervous/anxious.    Objective:  BP 120/82 (BP Location:  Left Arm)   Pulse 96   Temp 98.8 F (37.1 C) (Oral)   Ht 5\' 6"  (1.676 m)   Wt 232 lb 3.2 oz (105.3 kg)   SpO2 96%   BMI 37.48 kg/m   BP Readings from Last 3 Encounters:  02/18/21 120/82  06/19/20 122/82  06/05/20 122/90    Wt Readings from Last 3 Encounters:  02/18/21 232 lb 3.2 oz (105.3 kg)  06/19/20 217 lb (98.4 kg)  06/05/20 217 lb (98.4 kg)    Physical Exam Constitutional:      General: He is not in acute distress.    Appearance: He is well-developed.     Comments: NAD  Eyes:     Conjunctiva/sclera: Conjunctivae normal.     Pupils: Pupils are equal, round, and reactive to light.  Neck:     Thyroid: No thyromegaly.     Vascular: No JVD.  Cardiovascular:     Rate and Rhythm: Normal rate and regular rhythm.     Heart sounds: Normal heart sounds. No murmur heard.   No friction rub. No gallop.  Pulmonary:     Effort: Pulmonary effort is normal. No respiratory distress.     Breath sounds: Normal breath sounds. No wheezing or rales.  Chest:     Chest wall: No tenderness.  Abdominal:     General: Bowel sounds are normal. There is no distension.     Palpations: Abdomen is soft. There is no mass.     Tenderness: There is no abdominal tenderness. There is no  guarding or rebound.  Musculoskeletal:        General: No tenderness. Normal range of motion.     Cervical back: Normal range of motion.  Lymphadenopathy:     Cervical: No cervical adenopathy.  Skin:    General: Skin is warm and dry.     Findings: No rash.  Neurological:     Mental Status: He is alert and oriented to person, place, and time.     Cranial Nerves: No cranial nerve deficit.     Motor: No abnormal muscle tone.     Coordination: Coordination normal.     Gait: Gait normal.     Deep Tendon Reflexes: Reflexes are normal and symmetric.  Psychiatric:        Behavior: Behavior normal.        Thought Content: Thought content normal.        Judgment: Judgment normal.     A total time of 45 minutes  was spent preparing to see the patient, reviewing tests, x-rays, operative reports and outside records.  Also, obtaining history and performing comprehensive physical exam.  Additionally, counseling the patient regarding migraine headache, migraine triggers, treatment options available now.  Finally, documenting clinical information in the health records, coordination of care, educating the patient.  Lab Results  Component Value Date   WBC 6.9 06/05/2020   HGB 16.0 06/05/2020   HCT 47.1 06/05/2020   PLT 262.0 06/05/2020   GLUCOSE 68 (L) 06/05/2020   CHOL 174 09/24/2017   TRIG 88.0 09/24/2017   HDL 37.40 (L) 09/24/2017   LDLCALC 119 (H) 09/24/2017   ALT 17 06/05/2020   AST 19 06/05/2020   NA 139 06/05/2020   K 4.4 06/05/2020   CL 102 06/05/2020   CREATININE 1.48 06/05/2020   BUN 15 06/05/2020   CO2 32 06/05/2020   TSH 0.98 06/05/2020    MR SHOULDER RIGHT WO CONTRAST  Result Date: 07/07/2020 CLINICAL DATA:  Chronic shoulder pain for 2-3 years, recently worsening. Temporary relief from steroid injection. Rotator cuff disorder suspected. No previous relevant surgery. EXAM: MRI OF THE RIGHT SHOULDER WITHOUT CONTRAST TECHNIQUE: Multiplanar, multisequence MR imaging of the shoulder was performed. No intravenous contrast was administered. COMPARISON:  Radiographs 06/05/2020 FINDINGS: Rotator cuff:  Intact without significant tendinosis. Muscles:  No focal muscular atrophy or edema. Biceps long head:  Intact and normally positioned. Acromioclavicular Joint: The acromion is type 2. 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 The acromioclavicular joint appears normal. No significant fluid is present in the subacromial - subdeltoid bursa. Glenohumeral Joint: No significant shoulder joint effusion or glenohumeral arthropathy. Labrum: Labral assessment is limited by the lack of joint fluid. No evidence of labral tear or paralabral cyst. Bones: No acute or significant  extra-articular osseous findings. Other: No significant soft tissue findings. IMPRESSION: 1. No acute findings or evidence of internal derangement. 2. 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 No apparent rotator cuff impingement. Electronically Signed   By: Carey Bullocks M.D.   On: 07/07/2020 12:27    Assessment & Plan:   Problem List Items Addressed This Visit     Migraine headache with aura    Chronic - worse Tried Maxalt, Topamax, Pamelor CT head (-) 9/22      Relevant Medications   SUMAtriptan (IMITREX) 100 MG tablet   naproxen (NAPROSYN) 500 MG tablet   Nausea & vomiting    Migraine related Zofran prn      Snoring    R/o  OSA - discussed         Follow-up: No follow-ups on file.  Sonda Primes, MD

## 2021-02-18 NOTE — Assessment & Plan Note (Signed)
Migraine related Zofran prn 

## 2021-04-21 ENCOUNTER — Ambulatory Visit: Payer: BC Managed Care – PPO | Admitting: Internal Medicine

## 2021-07-22 ENCOUNTER — Ambulatory Visit: Payer: Self-pay

## 2021-07-22 ENCOUNTER — Ambulatory Visit (INDEPENDENT_AMBULATORY_CARE_PROVIDER_SITE_OTHER): Payer: BC Managed Care – PPO | Admitting: Family Medicine

## 2021-07-22 ENCOUNTER — Other Ambulatory Visit: Payer: Self-pay

## 2021-07-22 ENCOUNTER — Encounter: Payer: Self-pay | Admitting: Family Medicine

## 2021-07-22 VITALS — BP 128/92 | HR 94 | Ht 66.0 in | Wt 229.0 lb

## 2021-07-22 DIAGNOSIS — M25511 Pain in right shoulder: Secondary | ICD-10-CM | POA: Diagnosis not present

## 2021-07-22 DIAGNOSIS — M5010 Cervical disc disorder with radiculopathy, unspecified cervical region: Secondary | ICD-10-CM

## 2021-07-22 DIAGNOSIS — M7551 Bursitis of right shoulder: Secondary | ICD-10-CM

## 2021-07-22 NOTE — Progress Notes (Signed)
?Dylan Thornton D.O. ?[facility] ?[address] [place] [address] ?Phone: [phone] ?Subjective:   ?I, Dylan Thornton, am serving as a scribe for Dr. Antoine Thornton. ? ?This visit occurred during the SARS-CoV-2 public health emergency.  Safety protocols were in place, including screening questions prior to the visit, additional usage of staff PPE, and extensive cleaning of exam room while observing appropriate contact time as indicated for disinfecting solutions.  ?I'm seeing this patient by the request  of:  Thornton, Dylan Quint, MD ? ?CC: Right shoulder pain follow-up ? ?[id number]  ?Dylan Thornton. is a 41 y.o. male coming in with complaint of neck and R shoulder pain. Last seen one year ago. Patient states that he has nagging pain in anterior shoulder that radiates into cervical spine. Patient states that he lifted something heavy over the weekend and was unable to comfortably turn neck to L side. No new injury. Pain is not going away. Intermittent pain that radiates down the arm.  ? ?MRI R shoulder Feb 2022 ?IMPRESSION: ?1. No acute findings or evidence of internal derangement. ?2. 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 No ?apparent rotator cuff impingement. ?  ?MRI cervical Feb 2022 ?IMPRESSION: ?1. No impingement to explain arm symptoms. ?2. Tiny noncompressive disc protrusions at C4-5 and C5-6. ?  ? ?No past medical history on file. ?No past surgical history on file. ?Social History  ? ?Socioeconomic History  ? Marital status: Married  ?  Spouse name: Not on file  ? Number of children: 0  ? Years of education: 29  ? Highest education level: Not on file  ?Occupational History  ? Occupation: Sport and exercise psychologist  ?Tobacco Use  ? Smoking status: Never  ? Smokeless tobacco: Never  ?Substance and Sexual Activity  ? Alcohol use: Yes  ?  Comment: Socially  ? Drug use: No  ? Sexual activity: Not on file  ?Other Topics Concern  ? Not on file  ?Social History Narrative  ?  Fun: Sleep   ? ?Social Determinants of Health  ? ?Financial Resource Strain: Not on file  ?Food Insecurity: Not on file  ?Transportation Needs: Not on file  ?Physical Activity: Not on file  ?Stress: Not on file  ?Social Connections: Not on file  ? ?No Known Allergies ?Family History  ?Problem Relation Age of Onset  ? Hypertension Mother   ? Gout Father   ? ? ? ? ? ?Current Outpatient Medications (Analgesics):  ?  naproxen (NAPROSYN) 500 MG tablet, Take 1 tablet (500 mg total) by mouth 2 (two) times daily as needed for headache (headache, take with sumatriptan). ?  SUMAtriptan (IMITREX) 100 MG tablet, Take 1 tablet (100 mg total) by mouth every 2 (two) hours as needed for migraine. May repeat in 2 hours if headache persists or recurs. ? ? ?Current Outpatient Medications (Other):  ?  cholecalciferol (VITAMIN D3) 25 MCG (1000 UNIT) tablet, Take 1 tablet (1,000 Units total) by mouth daily. ?  ondansetron (ZOFRAN) 4 MG tablet, Take 1 tablet (4 mg total) by mouth every 8 (eight) hours as needed for nausea or vomiting (migraine nausea). ? ? ?Reviewed prior external information including notes and imaging from  ?primary care provider ?As well as notes that were available from care everywhere and other healthcare systems. ? ?Past medical history, social, surgical and family history all reviewed in electronic medical record.  No pertanent information unless stated regarding to the chief complaint.  ? ?Review of Systems: ? No headache,  visual changes, nausea, vomiting, diarrhea, constipation, dizziness, abdominal pain, skin rash, fevers, chills, night sweats, weight loss, swollen lymph nodes, body aches, joint swelling, chest pain, shortness of breath, mood changes. POSITIVE muscle aches ? ?Objective  ?Blood pressure (!) 128/92, pulse 94, height 5\' 6"  (1.676 m), weight 229 lb (103.9 kg), SpO2 95 %. ?  ?General: No apparent distress alert and oriented x3 mood and affect normal, dressed appropriately.  ?HEENT: Pupils equal,  extraocular movements intact  ?Respiratory: Patient's speak in full sentences and does not appear short of breath  ?Cardiovascular: No lower extremity edema, non tender, no erythema  ?Gait normal with good balance and coordination.  ?MSK: Neck exam does have some mild loss of lordosis.  The patient does have some mild limited sidebending.  The patient does have improvement from patient's weakness from previous exam but continues to have impingement. ? ? ?Procedure: Real-time Ultrasound Guided Injection of right subacromial space Device: GE Logiq Q7  ?Ultrasound guided injection is preferred based studies that show increased duration, increased effect, greater accuracy, decreased procedural pain, increased response rate with ultrasound guided versus blind injection.  ?Verbal informed consent obtained.  ?Time-out conducted.  ?Noted no overlying erythema, induration, or other signs of local infection.  ?Skin prepped in a sterile fashion.  ?Local anesthesia: Topical Ethyl chloride.  ?With sterile technique and under real time ultrasound guidance:  Joint visualized.  23g 1 ? inch needle inserted posterior approach. Pictures taken for needle placement. Patient did have injection of , 2 cc of 0.5% Marcaine, and 1.0 cc of Kenalog 40 mg/dL. ?Completed without difficulty  ?Pain immediately resolved suggesting accurate placement of the medication.  ?Advised to call if fevers/chills, erythema, induration, drainage, or persistent bleeding.  ?Impression: Technically successful ultrasound guided injection. ?  ?Impression and Recommendations:  ?  ? ?The above documentation has been reviewed and is accurate and complete , DO ? ? ? ?

## 2021-07-22 NOTE — Assessment & Plan Note (Signed)
Patient given injection at this time.  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  Hopefully patient does respond extremely well to this otherwise we will need to consider the possibility of an epidural in the cervical spine.  If patient fails this then we will need to consider the possibility of surgical intervention but I highly do not think this will be necessary.  Follow-up with me again 6 to 12 weeks ?

## 2021-07-22 NOTE — Patient Instructions (Signed)
Injected shoulder today ?Exercises ?In 2 weeks send message and if not better can do cervical epidural ?Have appt in 6 weeks ?

## 2021-07-31 ENCOUNTER — Ambulatory Visit: Payer: BC Managed Care – PPO | Admitting: Family Medicine

## 2021-09-01 NOTE — Progress Notes (Signed)
?Dylan Thornton D.O. ?[facility] ?[address] [place] [address] ?Phone: [phone] ?Subjective:   ?I, Dylan Thornton, am serving as a scribe for Dr. Antoine Thornton. ? ?This visit occurred during the SARS-CoV-2 public health emergency.  Safety protocols were in place, including screening questions prior to the visit, additional usage of staff PPE, and extensive cleaning of exam room while observing appropriate contact time as indicated for disinfecting solutions.  ? ? ?I'm seeing this patient by the request  of:  Plotnikov, Dylan Quint, MD ? ?CC: right shoulder pain  ? ?[id number]  ?07/22/2021 ?Patient given injection at this time.  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  Hopefully patient does respond extremely well to this otherwise we will need to consider the possibility of an epidural in the cervical spine.  If patient fails this then we will need to consider the possibility of surgical intervention but I highly do not think this will be necessary.  Follow-up with me again 6 to 12 weeks ? ?Updated 09/02/2021 ?Dylan Thornton. is a 41 y.o. male coming in with complaint of right shoulder pain, seen 1 month ago. Given injection. Patient states that his pain is less after injection. Tingling did go away in hand. Painful with flexion over AC joint.  ? ? 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  ? ?  ? ?No past medical history on file. ?No past surgical history on file. ?Social History  ? ?Socioeconomic History  ? Marital status: Married  ?  Spouse name: Not on file  ? Number of children: 0  ? Years of education: 57  ? Highest education level: Not on file  ?Occupational History  ? Occupation: Sport and exercise psychologist  ?Tobacco Use  ? Smoking status: Never  ? Smokeless tobacco: Never  ?Substance and Sexual Activity  ? Alcohol use: Yes  ?  Comment: Socially  ? Drug use: No  ? Sexual activity: Not on file  ?Other Topics Concern  ? Not on file   ?Social History Narrative  ? Fun: Sleep   ? ?Social Determinants of Health  ? ?Financial Resource Strain: Not on file  ?Food Insecurity: Not on file  ?Transportation Needs: Not on file  ?Physical Activity: Not on file  ?Stress: Not on file  ?Social Connections: Not on file  ? ?No Known Allergies ?Family History  ?Problem Relation Age of Onset  ? Hypertension Mother   ? Gout Father   ? ? ? ? ? ?Current Outpatient Medications (Analgesics):  ?  naproxen (NAPROSYN) 500 MG tablet, Take 1 tablet (500 mg total) by mouth 2 (two) times daily as needed for headache (headache, take with sumatriptan). ?  SUMAtriptan (IMITREX) 100 MG tablet, Take 1 tablet (100 mg total) by mouth every 2 (two) hours as needed for migraine. May repeat in 2 hours if headache persists or recurs. ? ? ?Current Outpatient Medications (Other):  ?  cholecalciferol (VITAMIN D3) 25 MCG (1000 UNIT) tablet, Take 1 tablet (1,000 Units total) by mouth daily. ?  ondansetron (ZOFRAN) 4 MG tablet, Take 1 tablet (4 mg total) by mouth every 8 (eight) hours as needed for nausea or vomiting (migraine nausea). ? ? ?Reviewed prior external information including notes and imaging from  ?primary care provider ?As well as notes that were available from care everywhere and other healthcare systems. ? ?Past medical history, social, surgical and family history all reviewed in electronic medical record.  No pertanent information unless  stated regarding to the chief complaint.  ? ?Review of Systems: ? No headache, visual changes, nausea, vomiting, diarrhea, constipation, dizziness, abdominal pain, skin rash, fevers, chills, night sweats, weight loss, swollen lymph nodes, body aches, joint swelling, chest pain, shortness of breath, mood changes. POSITIVE muscle aches ? ?Objective  ?There were no vitals taken for this visit. ?  ?General: No apparent distress alert and oriented x3 mood and affect normal, dressed appropriately.  ?HEENT: Pupils equal, extraocular movements intact   ?Respiratory: Patient's speak in full sentences and does not appear short of breath  ?Cardiovascular: No lower extremity edema, non tender, no erythema  ?Gait normal with good balance and coordination.  ?MSK: Right shoulder exam still has some mild positive impingement noted.  Some very limited with sidebending of the neck to that side.  Patient has improvement though in range of motion at the moment.  Does have positive crossover of the right shoulder. ? ?Procedure: Real-time Ultrasound Guided Injection of right acromioclavicular joint ?Device: GE Logiq Q7 ?Ultrasound guided injection is preferred based studies that show increased duration, increased effect, greater accuracy, decreased procedural pain, increased response rate, and decreased cost with ultrasound guided versus blind injection.  ?Verbal informed consent obtained.  ?Time-out conducted.  ?Noted no overlying erythema, induration, or other signs of local infection.  ?Skin prepped in a sterile fashion.  ?Local anesthesia: Topical Ethyl chloride.  ?With sterile technique and under real time ultrasound guidance: With a 25-gauge half inch needle injecting 0.5 cc of 0.5% Marcaine and 0.5 cc of Kenalog 40 mg/mL ?Completed without difficulty  ?Pain immediately resolved suggesting accurate placement of the medication.  ?Advised to call if fevers/chills, erythema, induration, drainage, or persistent bleeding.  ?Impression: Technically successful ultrasound guided injection. ? ?  ?Impression and Recommendations:  ?  ? ?The above documentation has been reviewed and is accurate and complete Dylan Saa, DO ? ? ? ?

## 2021-09-02 ENCOUNTER — Encounter: Payer: Self-pay | Admitting: Family Medicine

## 2021-09-02 ENCOUNTER — Ambulatory Visit: Payer: Self-pay

## 2021-09-02 ENCOUNTER — Ambulatory Visit: Payer: BC Managed Care – PPO | Admitting: Family Medicine

## 2021-09-02 VITALS — BP 132/92 | HR 100 | Ht 66.0 in | Wt 229.0 lb

## 2021-09-02 DIAGNOSIS — M25511 Pain in right shoulder: Secondary | ICD-10-CM | POA: Diagnosis not present

## 2021-09-02 DIAGNOSIS — M7581 Other shoulder lesions, right shoulder: Secondary | ICD-10-CM

## 2021-09-02 NOTE — Patient Instructions (Signed)
Good luck cleaning out dorms ?See you again in 2 months ?

## 2021-09-05 DIAGNOSIS — M7581 Other shoulder lesions, right shoulder: Secondary | ICD-10-CM | POA: Insufficient documentation

## 2021-09-05 NOTE — Assessment & Plan Note (Signed)
Patient given injection today and tolerated the procedure well. 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  Patient has had this pain intermittently.  Seems to be worse with repetitive activity.  Follow-up with me again in 6 to 8 weeks. ?

## 2021-09-11 ENCOUNTER — Ambulatory Visit: Payer: BC Managed Care – PPO | Admitting: Internal Medicine

## 2021-09-12 ENCOUNTER — Encounter: Payer: Self-pay | Admitting: Internal Medicine

## 2021-09-12 ENCOUNTER — Ambulatory Visit: Payer: BC Managed Care – PPO | Admitting: Internal Medicine

## 2021-09-12 VITALS — BP 120/86 | HR 83 | Resp 18 | Ht 66.0 in | Wt 227.4 lb

## 2021-09-12 DIAGNOSIS — R1084 Generalized abdominal pain: Secondary | ICD-10-CM | POA: Diagnosis not present

## 2021-09-12 DIAGNOSIS — M7989 Other specified soft tissue disorders: Secondary | ICD-10-CM

## 2021-09-12 LAB — COMPREHENSIVE METABOLIC PANEL
ALT: 20 U/L (ref 0–53)
AST: 21 U/L (ref 0–37)
Albumin: 4.6 g/dL (ref 3.5–5.2)
Alkaline Phosphatase: 75 U/L (ref 39–117)
BUN: 16 mg/dL (ref 6–23)
CO2: 30 mEq/L (ref 19–32)
Calcium: 9.2 mg/dL (ref 8.4–10.5)
Chloride: 102 mEq/L (ref 96–112)
Creatinine, Ser: 1.38 mg/dL (ref 0.40–1.50)
GFR: 64.86 mL/min (ref >60.00)
Glucose, Bld: 84 mg/dL (ref 70–99)
Potassium: 4.1 mEq/L (ref 3.5–5.1)
Sodium: 139 mEq/L (ref 135–145)
Total Bilirubin: 0.7 mg/dL (ref 0.2–1.2)
Total Protein: 7.3 g/dL (ref 6.0–8.3)

## 2021-09-12 LAB — CBC
HCT: 47.4 % (ref 39.0–52.0)
Hemoglobin: 15.8 g/dL (ref 13.0–17.0)
MCHC: 33.4 g/dL (ref 30.0–36.0)
MCV: 85.2 fl (ref 78.0–100.0)
Platelets: 251 10*3/uL (ref 150.0–400.0)
RBC: 5.56 Mil/uL (ref 4.22–5.81)
RDW: 14.3 % (ref 11.5–15.5)
WBC: 5.5 10*3/uL (ref 4.0–10.5)

## 2021-09-12 LAB — LIPASE: Lipase: 21 U/L (ref 11.0–59.0)

## 2021-09-12 MED ORDER — TRIAMCINOLONE ACETONIDE 0.1 % EX CREA: 1.0000 "application " | TOPICAL_CREAM | Freq: Two times a day (BID) | CUTANEOUS | 0 refills | Status: DC

## 2021-09-12 NOTE — Assessment & Plan Note (Addendum)
Lower abdomen pain intermittent but severe and lasts for <1 hour. No medications have been tried. Checking CBC, CMP, lipase today. Last episode was 5 days ago and has been 1-2 times per week for last 3 months. He is having some chronic constipation. Asked him to start otc fiber to help with regularity to see if this improves pain. If no improvement will get imaging likely CT abdomen to rule out diverticulitis. No clear pattern with eating and not drinking alcohol. No recent change to diet or exercise to explain. ?

## 2021-09-12 NOTE — Patient Instructions (Addendum)
We have sent in a cream to use on the leg twice a day. ? ?We are checking the labs today and will have you start some fiber daily to help go more regular. If you are still having the stomach pain let us know. ? ? ?

## 2021-09-12 NOTE — Assessment & Plan Note (Signed)
Rx triamcinolone ointment for lesion on leg. If no improvement could consider CXR for screening for sarcoid. 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  ?

## 2021-09-12 NOTE — Progress Notes (Signed)
? ?  Subjective:  ? ?Patient ID: Dylan Thornton., male    DOB: 26-Apr-1981, 41 y.o.   MRN: [id number] ? ?HPI ?The patient is a 41 YO man coming in for lesion on leg and abdomen pain.  ? ?Review of Systems  ?Constitutional: Negative.   ?HENT: Negative.    ?Eyes: Negative.   ?Respiratory:  Negative for cough, chest tightness and shortness of breath.   ?Cardiovascular:  Negative for chest pain, palpitations and leg swelling.  ?Gastrointestinal:  Positive for abdominal pain and constipation. Negative for abdominal distention, anal bleeding, blood in stool, diarrhea, nausea, rectal pain and vomiting.  ?Musculoskeletal: Negative.   ?Skin:  Positive for rash.  ?Neurological: Negative.   ?Psychiatric/Behavioral: Negative.    ? ?Objective:  ?Physical Exam ?Constitutional:   ?   Appearance: He is well-developed.  ?HENT:  ?   Head: Normocephalic and atraumatic.  ?Cardiovascular:  ?   Rate and Rhythm: Normal rate and regular rhythm.  ?Pulmonary:  ?   Effort: Pulmonary effort is normal. No respiratory distress.  ?   Breath sounds: Normal breath sounds. No wheezing or rales.  ?Abdominal:  ?   General: Bowel sounds are normal. There is no distension.  ?   Palpations: Abdomen is soft.  ?   Tenderness: There is abdominal tenderness. There is no rebound.  ?   Comments: Mild tenderness LLQ  ?Musculoskeletal:  ?   Cervical back: Normal range of motion.  ?Skin: ?   General: Skin is warm and dry.  ?   Comments: Red lesion left shin which is sore to touch  ?Neurological:  ?   Mental Status: He is alert and oriented to person, place, and time.  ?   Coordination: Coordination normal.  ? ? ?Vitals:  ? 09/12/21 1607  ?BP: 120/86  ?Pulse: 83  ?Resp: 18  ?SpO2: 96%  ?Weight: 227 lb 6.4 oz (103.1 kg)  ?Height: 5\' 6"  (1.676 m)  ? ? ?This visit occurred during the SARS-CoV-2 public health emergency.  Safety protocols were in place, including screening questions prior to the visit, additional usage of staff PPE, and extensive cleaning of exam room  while observing appropriate contact time as indicated for disinfecting solutions.  ? ?Assessment & Plan:  ? ?

## 2021-10-24 IMAGING — DX DG LUMBAR SPINE COMPLETE 4+V
5 series · 5 of 5 positions shown · non-contrast
Comparison: None.

CLINICAL DATA: Back pain

EXAM:
LUMBAR SPINE - COMPLETE 4+ VIEW

[l-spine ap]
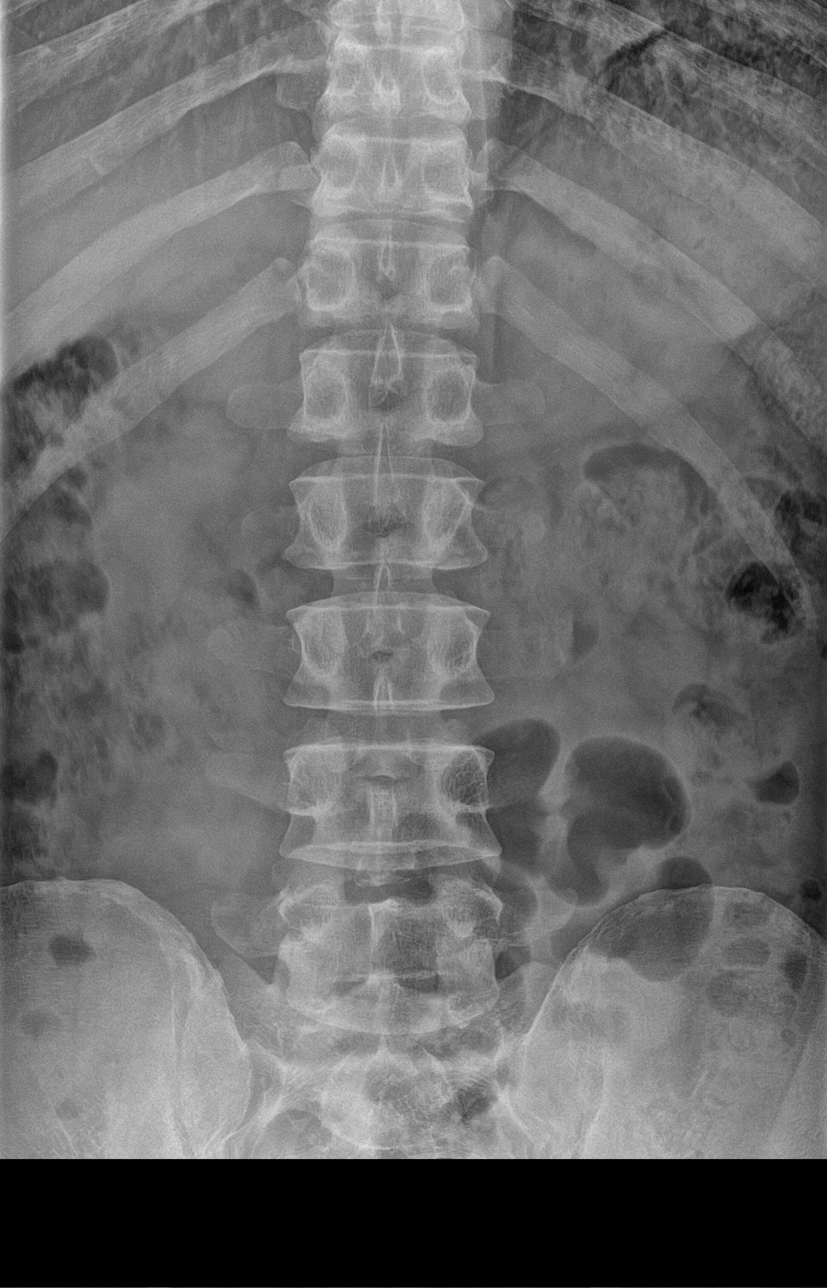

[l-spine obl (1 of 2)]
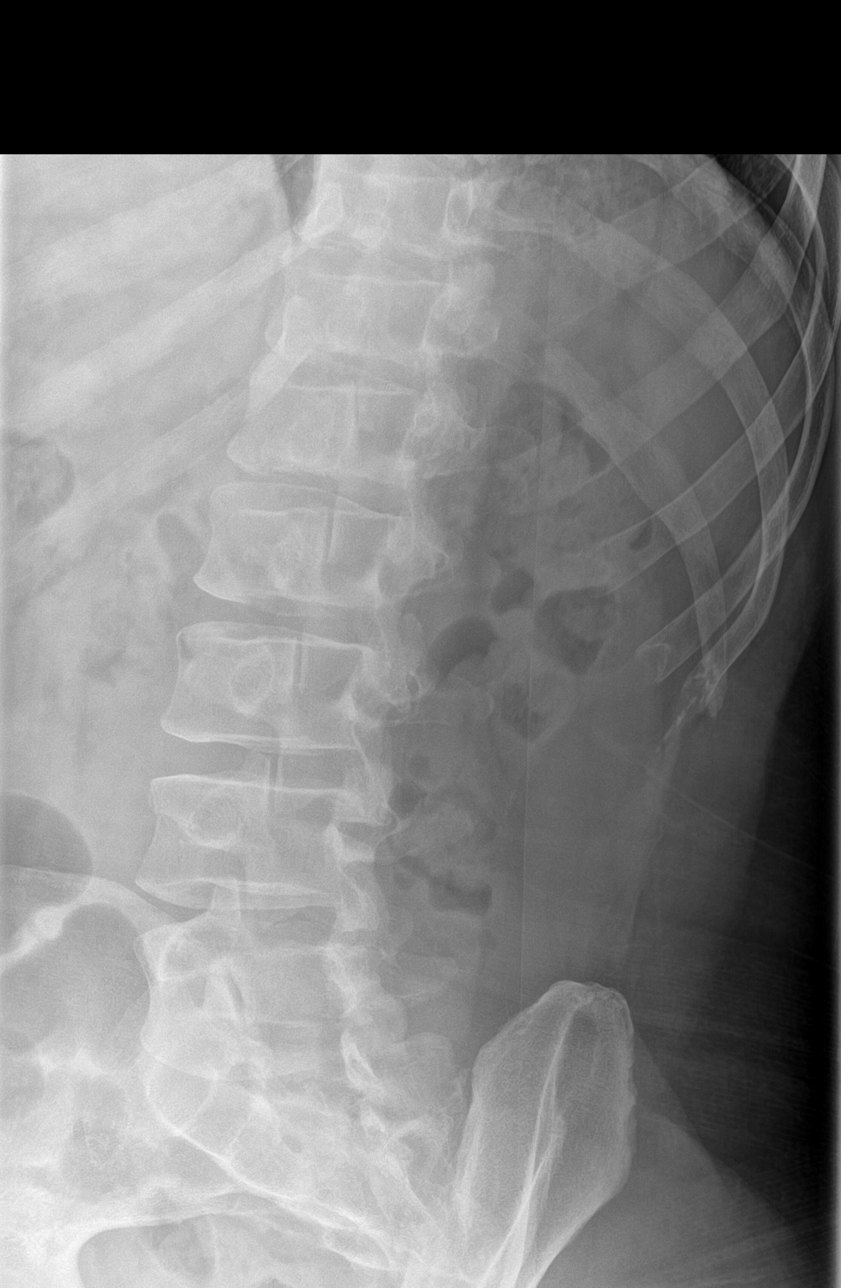

[l-spine obl (2 of 2)]
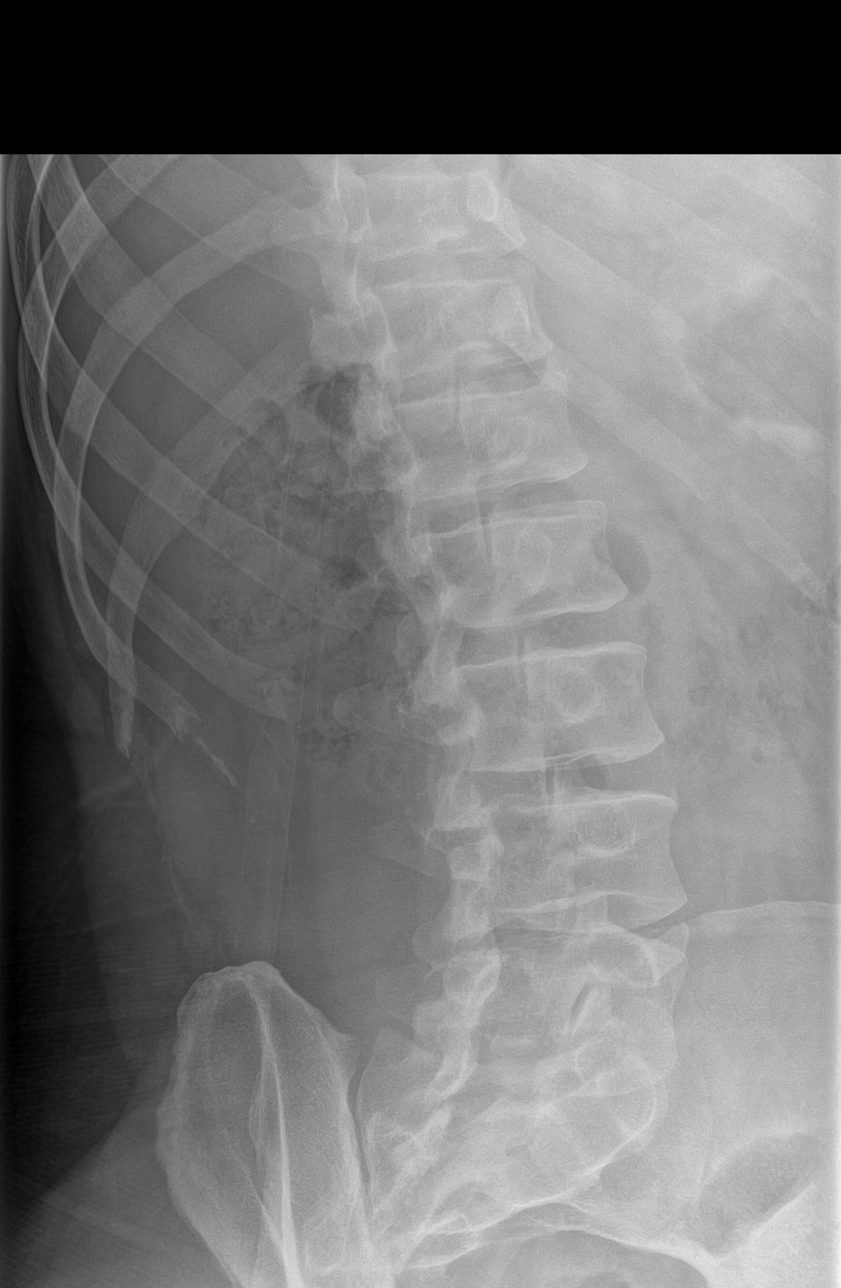

[l-spine lateral]
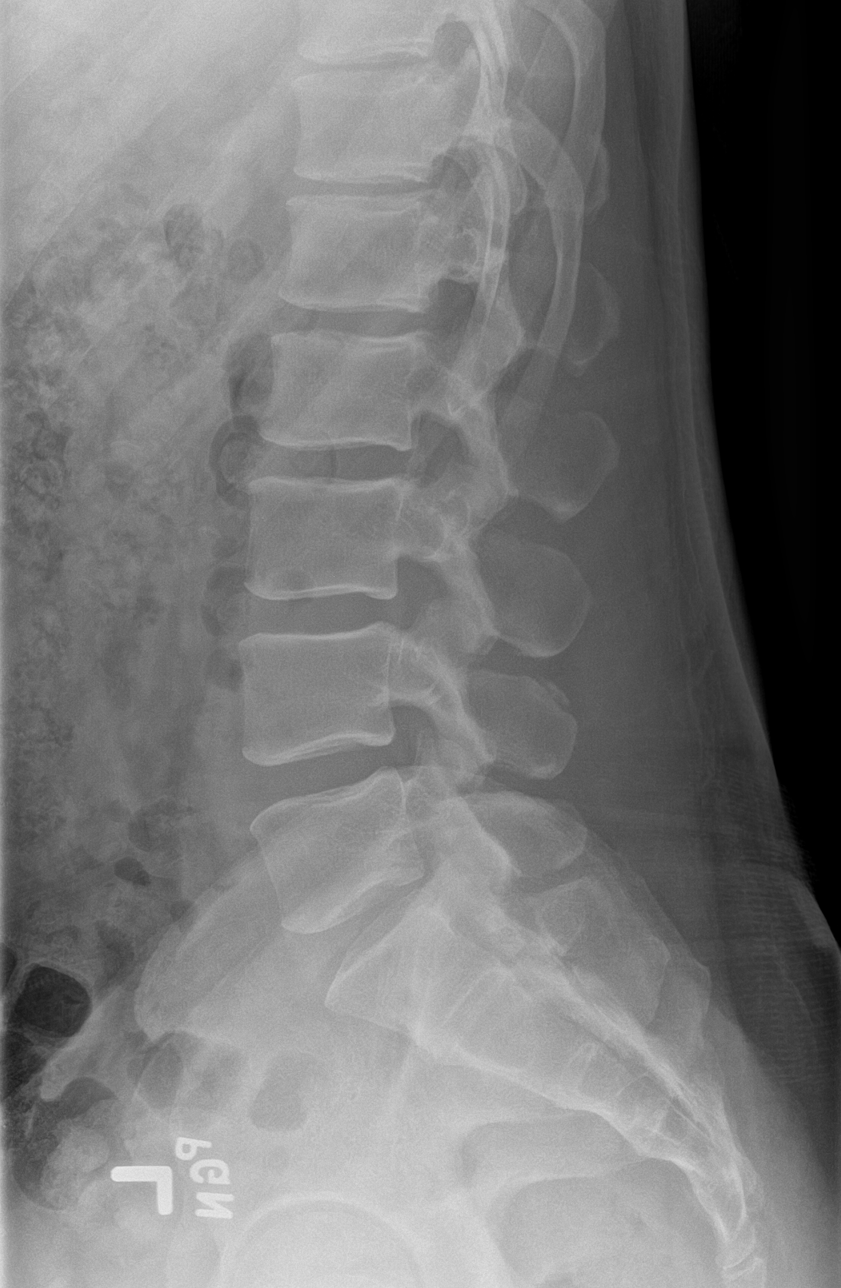

[l-spine spot]
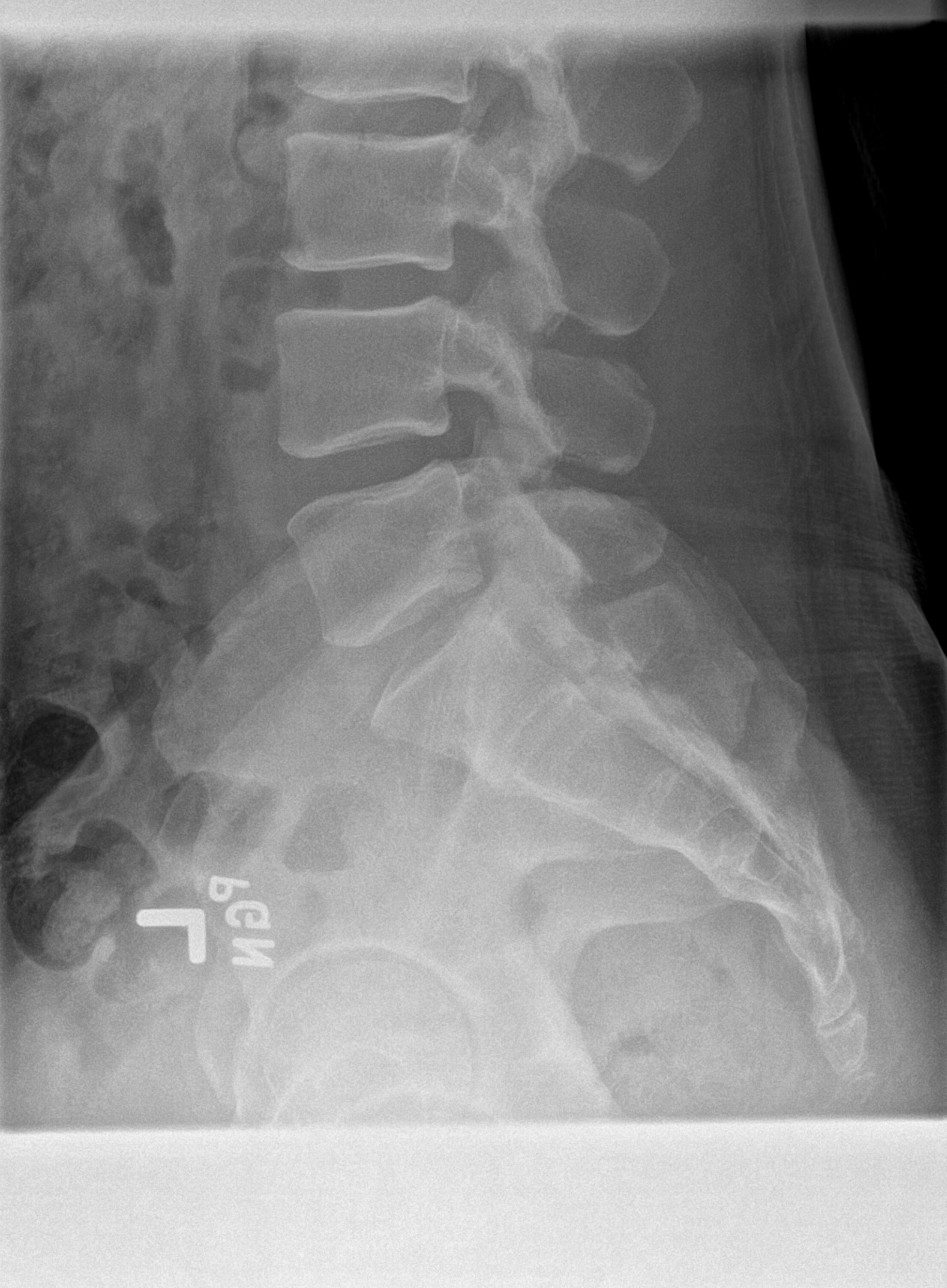

[5 of 5 positions shown; findings below may reference images not displayed]

FINDINGS: There is no evidence of lumbar spine fracture. Alignment is normal.
Intervertebral disc spaces are maintained.
IMPRESSION: Negative.

## 2021-10-29 NOTE — Progress Notes (Unsigned)
[facility] [address] [place] [address] Phone: [phone] Subjective:    I'm seeing this patient by the request  of:  Plotnikov, Georgina Quint, MD  CC:   [id number]  09/02/2021 Patient given injection today and tolerated the procedure well. 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  Patient has had this pain intermittently.  Seems to be worse with repetitive activity.  Follow-up with me again in 6 to 8 weeks.  Updated 11/04/2021 Dylan Thornton Dylan Thornton. is a 41 y.o. male coming in with complaint of right shoulder pain       No past medical history on file. No past surgical history on file. Social History   Socioeconomic History   Marital status: Married    Spouse name: Not on file   Number of children: 0   Years of education: 12   Highest education level: Not on file  Occupational History   Occupation: Sport and exercise psychologist  Tobacco Use   Smoking status: Never   Smokeless tobacco: Never  Substance and Sexual Activity   Alcohol use: Yes    Comment: Socially   Drug use: No   Sexual activity: Not on file  Other Topics Concern   Not on file  Social History Narrative   Fun: Sleep    Social Determinants of Health   Financial Resource Strain: Not on file  Food Insecurity: Not on file  Transportation Needs: Not on file  Physical Activity: Not on file  Stress: Not on file  Social Connections: Not on file   No Known Allergies Family History  Problem Relation Age of Onset   Hypertension Mother    Gout Father        Current Outpatient Medications (Analgesics):    naproxen (NAPROSYN) 500 MG tablet, Take 1 tablet (500 mg total) by mouth 2 (two) times daily as needed for headache (headache, take with sumatriptan).   SUMAtriptan (IMITREX) 100 MG tablet, Take 1 tablet (100 mg total) by mouth every 2 (two) hours as needed for migraine. May repeat in 2 hours if headache persists or recurs.   Current Outpatient  Medications (Other):    cholecalciferol (VITAMIN D3) 25 MCG (1000 UNIT) tablet, Take 1 tablet (1,000 Units total) by mouth daily.   ondansetron (ZOFRAN) 4 MG tablet, Take 1 tablet (4 mg total) by mouth every 8 (eight) hours as needed for nausea or vomiting (migraine nausea).   triamcinolone cream (KENALOG) 0.1 %, Apply 1 application. topically 2 (two) times daily.   Reviewed prior external information including notes and imaging from  primary care provider As well as notes that were available from care everywhere and other healthcare systems.  Past medical history, social, surgical and family history all reviewed in electronic medical record.  No pertanent information unless stated regarding to the chief complaint.   Review of Systems:  No headache, visual changes, nausea, vomiting, diarrhea, constipation, dizziness, abdominal pain, skin rash, fevers, chills, night sweats, weight loss, swollen lymph nodes, body aches, joint swelling, chest pain, shortness of breath, mood changes. POSITIVE muscle aches  Objective  There were no vitals taken for this visit.   General: No apparent distress alert and oriented x3 mood and affect normal, dressed appropriately.  HEENT: Pupils equal, extraocular movements intact  Respiratory: Patient's speak in full sentences and does not appear short of breath  Cardiovascular: No lower extremity edema, non tender, no erythema      Impression and Recommendations:

## 2021-11-04 ENCOUNTER — Ambulatory Visit: Payer: Self-pay

## 2021-11-04 ENCOUNTER — Ambulatory Visit (INDEPENDENT_AMBULATORY_CARE_PROVIDER_SITE_OTHER): Payer: BC Managed Care – PPO | Admitting: Family Medicine

## 2021-11-04 VITALS — BP 124/86 | HR 86 | Ht 66.0 in | Wt 235.0 lb

## 2021-11-04 DIAGNOSIS — M25511 Pain in right shoulder: Secondary | ICD-10-CM

## 2021-11-04 DIAGNOSIS — M7581 Other shoulder lesions, right shoulder: Secondary | ICD-10-CM

## 2022-04-27 ENCOUNTER — Ambulatory Visit: Payer: BC Managed Care – PPO | Admitting: Emergency Medicine

## 2022-04-27 ENCOUNTER — Encounter: Payer: Self-pay | Admitting: Emergency Medicine

## 2022-04-27 VITALS — BP 148/98 | HR 91 | Temp 98.3°F | Ht 66.0 in | Wt 236.1 lb

## 2022-04-27 DIAGNOSIS — R051 Acute cough: Secondary | ICD-10-CM | POA: Diagnosis not present

## 2022-04-27 DIAGNOSIS — J069 Acute upper respiratory infection, unspecified: Secondary | ICD-10-CM

## 2022-04-27 MED ORDER — HYDROCODONE BIT-HOMATROP MBR 5-1.5 MG/5ML PO SOLN: 5.0000 mL | Freq: Every evening | ORAL | 0 refills | Status: DC | PRN

## 2022-04-27 NOTE — Patient Instructions (Signed)

## 2022-04-27 NOTE — Assessment & Plan Note (Signed)
Stable and running its course without complications. Advised to rest and stay well-hydrated. Over-the-counter medications as needed for symptom control

## 2022-04-27 NOTE — Progress Notes (Signed)
Dylan P Heid Jr. 41 y.o.   Chief Complaint  Patient presents with   Acute Visit    Dry cough x 1 week     HISTORY OF PRESENT ILLNESS: This is a 41 y.o. male complaining of dry cough that started about 2 weeks ago. Recently returned from a trip to [place].  Son was also sick with similar symptoms. Slowly getting better.  Today at least 50% better.  No other associated symptoms no complications. No other complaints or medical concerns today.  HPI   Prior to Admission medications   Medication Sig Start Date End Date Taking? Authorizing Provider  naproxen (NAPROSYN) 500 MG tablet Take 1 tablet (500 mg total) by mouth 2 (two) times daily as needed for headache (headache, take with sumatriptan). 02/18/21  Yes Plotnikov, Georgina Quint, MD  ondansetron (ZOFRAN) 4 MG tablet Take 1 tablet (4 mg total) by mouth every 8 (eight) hours as needed for nausea or vomiting (migraine nausea). 02/18/21  Yes Plotnikov, Georgina Quint, MD  SUMAtriptan (IMITREX) 100 MG tablet Take 1 tablet (100 mg total) by mouth every 2 (two) hours as needed for migraine. May repeat in 2 hours if headache persists or recurs. 02/18/21  Yes Plotnikov, Georgina Quint, MD  triamcinolone cream (KENALOG) 0.1 % Apply 1 application. topically 2 (two) times daily. 09/12/21  Yes Myrlene Broker, MD    No Known Allergies  Patient Active Problem List   Diagnosis Date Noted   Generalized abdominal pain 09/12/2021   AC (acromioclavicular) joint bone spurs, right 09/05/2021   Migraine headache with aura 02/18/2021   Snoring 02/18/2021   Cervical disc disorder with radiculopathy of cervical region 06/05/2020   Low back pain 06/05/2020   Polyarthralgia 06/05/2020   Left leg swelling 12/02/2016   Migraine without aura and without status migrainosus, not intractable 07/02/2016   Onychia and paronychia of toe 05/31/2013   Ingrown nail 05/31/2013    No past medical history on file.  No past surgical history on file.  Social  History   Socioeconomic History   Marital status: Married    Spouse name: Not on file   Number of children: 0   Years of education: 12   Highest education level: Not on file  Occupational History   Occupation: Sport and exercise psychologist  Tobacco Use   Smoking status: Never   Smokeless tobacco: Never  Substance and Sexual Activity   Alcohol use: Yes    Comment: Socially   Drug use: No   Sexual activity: Not on file  Other Topics Concern   Not on file  Social History Narrative   Fun: Sleep    Social Determinants of Health   Financial Resource Strain: Not on file  Food Insecurity: Not on file  Transportation Needs: Not on file  Physical Activity: Not on file  Stress: Not on file  Social Connections: Not on file  Intimate Partner Violence: Not on file    Family History  Problem Relation Age of Onset   Hypertension Mother    Gout Father      Review of Systems  Constitutional: Negative.  Negative for chills and fever.  HENT: Negative.  Negative for sore throat.   Respiratory:  Positive for cough. Negative for shortness of breath.   Cardiovascular: Negative.  Negative for chest pain and palpitations.  Gastrointestinal:  Negative for abdominal pain, diarrhea, nausea and vomiting.  Genitourinary: Negative.  Negative for dysuria and hematuria.  Skin: Negative.  Negative for rash.  Neurological: Negative.  Negative for dizziness and headaches.  All other systems reviewed and are negative.  Today's Vitals   04/27/22 1605  BP: (!) 148/98  Pulse: 91  Temp: 98.3 F (36.8 C)  TempSrc: Oral  SpO2: 92%  Weight: 236 lb 2 oz (107.1 kg)  Height: 5\' 6"  (1.676 m)   Body mass index is 38.11 kg/m.   Physical Exam Vitals reviewed.  Constitutional:      Appearance: Normal appearance.  HENT:     Head: Normocephalic.     Mouth/Throat:     Mouth: Mucous membranes are moist.     Pharynx: Oropharynx is clear.  Eyes:     Extraocular Movements: Extraocular movements intact.      Conjunctiva/sclera: Conjunctivae normal.     Pupils: Pupils are equal, round, and reactive to light.  Cardiovascular:     Rate and Rhythm: Normal rate and regular rhythm.     Pulses: Normal pulses.     Heart sounds: Normal heart sounds.  Pulmonary:     Effort: Pulmonary effort is normal.     Breath sounds: Normal breath sounds.  Musculoskeletal:     Cervical back: No tenderness.  Lymphadenopathy:     Cervical: No cervical adenopathy.  Skin:    General: Skin is warm and dry.  Neurological:     General: No focal deficit present.     Mental Status: He is alert and oriented to person, place, and time.  Psychiatric:        Mood and Affect: Mood normal.        Behavior: Behavior normal.      ASSESSMENT & PLAN: Problem List Items Addressed This Visit       Respiratory   Viral upper respiratory infection - Primary    Stable and running its course without complications. Advised to rest and stay well-hydrated. Over-the-counter medications as needed for symptom control        Other   Acute cough    Take over-the-counter Mucinex DM and cough drops. Stay well-hydrated. Use Hycodan syrup at bedtime as needed.      Relevant Medications   HYDROcodone bit-homatropine (HYCODAN) 5-1.5 MG/5ML syrup   Patient Instructions  Cough, Adult A cough helps to clear your throat and lungs. A cough may be a sign of an illness or another medical condition. An acute cough may only last 2-3 weeks, while a chronic cough may last 8 or more weeks. Many things can cause a cough. They include: Germs (viruses or bacteria) that attack the airway. Breathing in things that bother (irritate) your lungs. Allergies. Asthma. Mucus that runs down the back of your throat (postnasal drip). Smoking. Acid backing up from the stomach into the tube that moves food from the mouth to the stomach (gastroesophageal reflux). Some medicines. Lung problems. Other medical conditions, such as heart failure or a  blood clot in the lung (pulmonary embolism). Follow these instructions at home: Medicines Take over-the-counter and prescription medicines only as told by your doctor. Talk with your doctor before you take medicines that stop a cough (cough suppressants). Lifestyle  Do not smoke, and try not to be around smoke. Do not use any products that contain nicotine or tobacco, such as cigarettes, e-cigarettes, and chewing tobacco. If you need help quitting, ask your doctor. Drink enough fluid to keep your pee (urine) pale yellow. Avoid caffeine. Do not drink alcohol if your doctor tells you not to drink. General instructions  Watch for any changes in your cough. Tell your doctor  about them. Always cover your mouth when you cough. Stay away from things that make you cough, such as perfume, candles, campfire smoke, or cleaning products. If the air is dry, use a cool mist vaporizer or humidifier in your home. If your cough is worse at night, try using extra pillows to raise your head up higher while you sleep. Rest as needed. Keep all follow-up visits as told by your doctor. This is important. Contact a doctor if: You have new symptoms. You cough up pus. Your cough does not get better after 2-3 weeks, or your cough gets worse. Cough medicine does not help your cough and you are not sleeping well. You have pain that gets worse or pain that is not helped with medicine. You have a fever. You are losing weight and you do not know why. You have night sweats. Get help right away if: You cough up blood. You have trouble breathing. Your heartbeat is very fast. These symptoms may be an emergency. Do not wait to see if the symptoms will go away. Get medical help right away. Call your local emergency services (911 in the [place] Do not drive yourself to the hospital. Summary A cough helps to clear your throat and lungs. Many things can cause a cough. Take over-the-counter and prescription medicines only  as told by your doctor. Always cover your mouth when you cough. Contact a doctor if you have new symptoms or you have a cough that does not get better or gets worse. This information is not intended to replace advice given to you by your health care provider. Make sure you discuss any questions you have with your health care provider. Document Revised: 06/16/2019 Document Reviewed: 05/16/2018 Elsevier Patient Education  2023 [employer]vier Inc.     Edwina Barth, MD [facility]

## 2022-04-27 NOTE — Assessment & Plan Note (Signed)
Take over-the-counter Mucinex DM and cough drops. Stay well-hydrated. Use Hycodan syrup at bedtime as needed.

## 2022-06-09 ENCOUNTER — Encounter: Payer: Self-pay | Admitting: Emergency Medicine

## 2022-06-09 ENCOUNTER — Ambulatory Visit: Payer: BC Managed Care – PPO | Admitting: Emergency Medicine

## 2022-06-09 VITALS — BP 136/84 | HR 101 | Temp 98.5°F | Ht 66.0 in | Wt 227.5 lb

## 2022-06-09 DIAGNOSIS — A46 Erysipelas: Secondary | ICD-10-CM | POA: Insufficient documentation

## 2022-06-09 MED ORDER — CEFADROXIL 500 MG PO CAPS: 500.0000 mg | ORAL_CAPSULE | Freq: Two times a day (BID) | ORAL | 0 refills | Status: AC

## 2022-06-09 NOTE — Patient Instructions (Signed)
Erysipelas  Erysipelas is an infection that affects the skin and tissues near the surface of the skin. It causes the skin to become red, swollen, and painful. The infection is most common on the legs but may also affect other areas, such as the face. With treatment, the infection usually goes away in a few days. If not treated, the infection can spread or lead to other problems, such as collections of pus (abscesses). What are the causes? This condition is caused by bacteria. Most often, it is caused by bacteria called streptococci.  Bacteria may get into the skin through a break in the skin, such as: A cut or scrape. An incision from surgery. A burn. An insect bite. An open sore. A crack in the skin. Sometimes, it is not known how the bacteria infected the skin. What increases the risk? You are more likely to develop this condition if you: Are a young child. Are an older adult. Have a weakened disease-fighting system (immune system), such as having HIV or AIDS. Have diabetes. Drink a lot of alcohol. Had recent surgery. Have a yeast infection of the skin. Have swollen legs. What are the signs or symptoms? The infection causes a reddened area on the skin. This reddened area may: Be painful and swollen. Have a clear border around it. Feel itchy and hot. Develop blisters or abscesses. Other symptoms may include: Fever. Chills. Nausea and vomiting. Swollen glands (lymph nodes), such as in the neck. Headache. Feeling tired (fatigue). Loss of appetite. How is this diagnosed? This condition is diagnosed based on: A physical exam. Your health care provider will examine your skin closely. Your symptoms and medical history. How is this treated? This condition is treated with antibiotic medicine. Symptoms usually get better within a few days after starting antibiotics. Follow these instructions at home: Medicines Take other over-the-counter and prescription medicines only as told by  your health care provider. Take your antibiotic medicine as told by your health care provider. Do not stop taking the antibiotic even if your condition starts to improve. Ask your health care provider if it is safe for you to take medicines for pain as needed, such as acetaminophen or ibuprofen. General instructions  If the affected area is on an arm or leg, raise (elevate) the affected arm or leg above the level of your heart while you are sitting or lying down. Do not put any creams or lotions on the affected area of your skin unless your health care provider tells you to do that. Do not share bedding, towels, or washcloths (linens) with other people. Use only your own linens to prevent the infection from spreading to others. Follow instructions from your health care provider about how to take care of your wound. Make sure you: Wash your hands with soap and water for at least 20 seconds before and after you change your bandage (dressing). If soap and water are not available, use hand sanitizer. Change your dressing as told by your health care provider. Keep all follow-up visits. This is important. Contact a health care provider if: Your symptoms do not improve within 1-2 days of starting treatment. You develop new symptoms. You have a fever. You feel generally sick (malaise) with muscle aches and pains. Get help right away if: Your symptoms get worse. You develop vomiting or diarrhea that does not go away. Your red area gets larger or turns dark in color. You notice red streaks coming from the infected area. Summary Erysipelas is an infection affecting  the skin and tissues near the surface of the skin. It causes the skin to become red, swollen, and painful. This condition is caused by bacteria. Most often, it is caused by bacteria called streptococci. Bacteria may enter through a break in the skin. Sometimes, it is not known how the bacteria infected the skin. This condition is treated  with antibiotic medicine. Symptoms usually get better within a few days after starting antibiotics. This information is not intended to replace advice given to you by your health care provider. Make sure you discuss any questions you have with your health care provider. Document Revised: 09/28/2019 Document Reviewed: 09/27/2019 Elsevier Patient Education  [place].

## 2022-06-09 NOTE — Assessment & Plan Note (Addendum)
Bacterial skin infection for 2 weeks.  No red flag signs or symptoms Localized.  No signs of systemic infection. No findings of DVT or superficial thrombophlebitis We will start Duricef 500 mg twice a day for at least 7 days No visible open wounds.  No crepitation.  No tenderness on palpation Advised to contact the office if no better or worse during the next several days

## 2022-06-09 NOTE — Progress Notes (Signed)
Dylan P Gaster Jr. 42 y.o.   Chief Complaint  Patient presents with   Acute Visit    Right leg, red, swollen, warm to touch, painful, poss cellulitis     HISTORY OF PRESENT ILLNESS: This is a 42 y.o. male complaining of swelling and redness to right lower leg for the past couple weeks Denies injury.  No other associated symptoms. No other complaint or medical concerns today.  HPI   Prior to Admission medications   Medication Sig Start Date End Date Taking? Authorizing Provider  HYDROcodone bit-homatropine (HYCODAN) 5-1.5 MG/5ML syrup Take 5 mLs by mouth at bedtime as needed for cough. 04/27/22  Yes Ciro Tashiro, Ines Bloomer, MD  naproxen (NAPROSYN) 500 MG tablet Take 1 tablet (500 mg total) by mouth 2 (two) times daily as needed for headache (headache, take with sumatriptan). 02/18/21  Yes Plotnikov, Evie Lacks, MD  ondansetron (ZOFRAN) 4 MG tablet Take 1 tablet (4 mg total) by mouth every 8 (eight) hours as needed for nausea or vomiting (migraine nausea). 02/18/21  Yes Plotnikov, Evie Lacks, MD  SUMAtriptan (IMITREX) 100 MG tablet Take 1 tablet (100 mg total) by mouth every 2 (two) hours as needed for migraine. May repeat in 2 hours if headache persists or recurs. 02/18/21  Yes Plotnikov, Evie Lacks, MD  triamcinolone cream (KENALOG) 0.1 % Apply 1 application. topically 2 (two) times daily. 09/12/21  Yes Hoyt Koch, MD    No Known Allergies  Patient Active Problem List   Diagnosis Date Noted   Viral upper respiratory infection 04/27/2022   Acute cough 04/27/2022   Generalized abdominal pain 09/12/2021   AC (acromioclavicular) joint bone spurs, right 09/05/2021   Migraine headache with aura 02/18/2021   Snoring 02/18/2021   Cervical disc disorder with radiculopathy of cervical region 06/05/2020   Low back pain 06/05/2020   Polyarthralgia 06/05/2020   Left leg swelling 12/02/2016   Migraine without aura and without status migrainosus, not intractable 07/02/2016   Onychia  and paronychia of toe 05/31/2013   Ingrown nail 05/31/2013    No past medical history on file.  No past surgical history on file.  Social History   Socioeconomic History   Marital status: Married    Spouse name: Not on file   Number of children: 0   Years of education: 12   Highest education level: Not on file  Occupational History   Occupation: Radiation protection practitioner  Tobacco Use   Smoking status: Never   Smokeless tobacco: Never  Substance and Sexual Activity   Alcohol use: Yes    Comment: Socially   Drug use: No   Sexual activity: Not on file  Other Topics Concern   Not on file  Social History Narrative   Fun: Sleep    Social Determinants of Health   Financial Resource Strain: Not on file  Food Insecurity: Not on file  Transportation Needs: Not on file  Physical Activity: Not on file  Stress: Not on file  Social Connections: Not on file  Intimate Partner Violence: Not on file    Family History  Problem Relation Age of Onset   Hypertension Mother    Gout Father      Review of Systems  Constitutional: Negative.  Negative for chills and fever.  HENT: Negative.  Negative for congestion and sore throat.   Respiratory: Negative.  Negative for cough and shortness of breath.   Cardiovascular: Negative.  Negative for chest pain and palpitations.  Gastrointestinal:  Negative for abdominal pain, diarrhea,  nausea and vomiting.  Genitourinary: Negative.  Negative for dysuria and hematuria.  Skin:  Positive for rash.  Neurological: Negative.  Negative for dizziness and headaches.  All other systems reviewed and are negative.  Today's Vitals   06/09/22 1555  BP: 136/84  Pulse: (!) 101  Temp: 98.5 F (36.9 C)  TempSrc: Oral  SpO2: 93%  Weight: 227 lb 8 oz (103.2 kg)  Height: 5\' 6"  (1.676 m)   Body mass index is 36.72 kg/m.   Physical Exam Vitals reviewed.  Constitutional:      Appearance: Normal appearance.  HENT:     Head: Normocephalic.  Eyes:      Extraocular Movements: Extraocular movements intact.  Cardiovascular:     Rate and Rhythm: Normal rate.  Pulmonary:     Effort: Pulmonary effort is normal.  Skin:    General: Skin is warm and dry.     Capillary Refill: Capillary refill takes less than 2 seconds.     Findings: Rash (Right lower extremity) present.  Neurological:     General: No focal deficit present.     Mental Status: He is alert and oriented to person, place, and time.  Psychiatric:        Mood and Affect: Mood normal.        Behavior: Behavior normal.      ASSESSMENT & PLAN: A total of 35 minutes was spent with the patient and counseling/coordination of care regarding preparing for this visit, review of most recent office visit notes, review of past medical history and chronic medical conditions, review of all medications, diagnosis of skin infection and need to start antibiotics, prognosis, documentation, need for follow-up if no better or worse during the next several days.  Problem List Items Addressed This Visit       Musculoskeletal and Integument   Erysipelas of right lower extremity - Primary    Bacterial skin infection for 2 weeks.  No red flag signs or symptoms Localized.  No signs of systemic infection. No findings of DVT or superficial thrombophlebitis We will start Duricef 500 mg twice a day for at least 7 days No visible open wounds.  No crepitation.  No tenderness on palpation Advised to contact the office if no better or worse during the next several days      Relevant Medications   cefadroxil (DURICEF) 500 MG capsule   Patient Instructions  Erysipelas  Erysipelas is an infection that affects the skin and tissues near the surface of the skin. It causes the skin to become red, swollen, and painful. The infection is most common on the legs but may also affect other areas, such as the face. With treatment, the infection usually goes away in a few days. If not treated, the infection can spread  or lead to other problems, such as collections of pus (abscesses). What are the causes? This condition is caused by bacteria. Most often, it is caused by bacteria called streptococci.  Bacteria may get into the skin through a break in the skin, such as: A cut or scrape. An incision from surgery. A burn. An insect bite. An open sore. A crack in the skin. Sometimes, it is not known how the bacteria infected the skin. What increases the risk? You are more likely to develop this condition if you: Are a young child. Are an older adult. Have a weakened disease-fighting system (immune system), such as having HIV or AIDS. Have diabetes. Drink a lot of alcohol. Had recent surgery. Have  a yeast infection of the skin. Have swollen legs. What are the signs or symptoms? The infection causes a reddened area on the skin. This reddened area may: Be painful and swollen. Have a clear border around it. Feel itchy and hot. Develop blisters or abscesses. Other symptoms may include: Fever. Chills. Nausea and vomiting. Swollen glands (lymph nodes), such as in the neck. Headache. Feeling tired (fatigue). Loss of appetite. How is this diagnosed? This condition is diagnosed based on: A physical exam. Your health care provider will examine your skin closely. Your symptoms and medical history. How is this treated? This condition is treated with antibiotic medicine. Symptoms usually get better within a few days after starting antibiotics. Follow these instructions at home: Medicines Take other over-the-counter and prescription medicines only as told by your health care provider. Take your antibiotic medicine as told by your health care provider. Do not stop taking the antibiotic even if your condition starts to improve. Ask your health care provider if it is safe for you to take medicines for pain as needed, such as acetaminophen or ibuprofen. General instructions  If the affected area is on an  arm or leg, raise (elevate) the affected arm or leg above the level of your heart while you are sitting or lying down. Do not put any creams or lotions on the affected area of your skin unless your health care provider tells you to do that. Do not share bedding, towels, or washcloths (linens) with other people. Use only your own linens to prevent the infection from spreading to others. Follow instructions from your health care provider about how to take care of your wound. Make sure you: Wash your hands with soap and water for at least 20 seconds before and after you change your bandage (dressing). If soap and water are not available, use hand sanitizer. Change your dressing as told by your health care provider. Keep all follow-up visits. This is important. Contact a health care provider if: Your symptoms do not improve within 1-2 days of starting treatment. You develop new symptoms. You have a fever. You feel generally sick (malaise) with muscle aches and pains. Get help right away if: Your symptoms get worse. You develop vomiting or diarrhea that does not go away. Your red area gets larger or turns dark in color. You notice red streaks coming from the infected area. Summary Erysipelas is an infection affecting the skin and tissues near the surface of the skin. It causes the skin to become red, swollen, and painful. This condition is caused by bacteria. Most often, it is caused by bacteria called streptococci. Bacteria may enter through a break in the skin. Sometimes, it is not known how the bacteria infected the skin. This condition is treated with antibiotic medicine. Symptoms usually get better within a few days after starting antibiotics. This information is not intended to replace advice given to you by your health care provider. Make sure you discuss any questions you have with your health care provider. Document Revised: 09/28/2019 Document Reviewed: 09/27/2019 Elsevier Patient  Education  [place], MD [facility]

## 2022-07-15 ENCOUNTER — Other Ambulatory Visit: Payer: Self-pay

## 2022-07-15 ENCOUNTER — Emergency Department (HOSPITAL_COMMUNITY): Payer: BC Managed Care – PPO

## 2022-07-15 ENCOUNTER — Emergency Department (HOSPITAL_COMMUNITY)
Admission: EM | Admit: 2022-07-15 | Discharge: 2022-07-15 | Disposition: A | Discharge status: Home / Self Care | Payer: BC Managed Care – PPO | Attending: Emergency Medicine | Admitting: Emergency Medicine

## 2022-07-15 DIAGNOSIS — R55 Syncope and collapse: Secondary | ICD-10-CM

## 2022-07-15 DIAGNOSIS — R112 Nausea with vomiting, unspecified: Secondary | ICD-10-CM | POA: Diagnosis not present

## 2022-07-15 LAB — COMPREHENSIVE METABOLIC PANEL
ALT: 25 U/L (ref 0–44)
AST: 25 U/L (ref 15–41)
Albumin: 4.5 g/dL (ref 3.5–5.0)
Alkaline Phosphatase: 69 U/L (ref 38–126)
Anion gap: 9 (ref 5–15)
BUN: 20 mg/dL (ref 6–20)
CO2: 27 mmol/L (ref 22–32)
Calcium: 8.7 mg/dL — ABNORMAL LOW (ref 8.9–10.3)
Chloride: 101 mmol/L (ref 98–111)
Creatinine, Ser: 1.33 mg/dL — ABNORMAL HIGH (ref 0.61–1.24)
GFR, Estimated: >60 mL/min (ref >60)
Glucose, Bld: 139 mg/dL — ABNORMAL HIGH (ref 70–99)
Potassium: 4.2 mmol/L (ref 3.5–5.1)
Sodium: 137 mmol/L (ref 135–145)
Total Bilirubin: 1.4 mg/dL — ABNORMAL HIGH (ref 0.3–1.2)
Total Protein: 7.4 g/dL (ref 6.5–8.1)

## 2022-07-15 LAB — CBC WITH DIFFERENTIAL/PLATELET
Abs Immature Granulocytes: 0.04 10*3/uL (ref 0.00–0.07)
Basophils Absolute: 0 10*3/uL (ref 0.0–0.1)
Basophils Relative: 0 %
Eosinophils Absolute: 0 10*3/uL (ref 0.0–0.5)
Eosinophils Relative: 0 %
HCT: 50.6 % (ref 39.0–52.0)
Hemoglobin: 16.6 g/dL (ref 13.0–17.0)
Immature Granulocytes: 1 %
Lymphocytes Relative: 7 %
Lymphs Abs: 0.5 10*3/uL — ABNORMAL LOW (ref 0.7–4.0)
MCH: 28.2 pg (ref 26.0–34.0)
MCHC: 32.8 g/dL (ref 30.0–36.0)
MCV: 86.1 fL (ref 80.0–100.0)
Monocytes Absolute: 0.5 10*3/uL (ref 0.1–1.0)
Monocytes Relative: 7 %
Neutro Abs: 5.9 10*3/uL (ref 1.7–7.7)
Neutrophils Relative %: 85 %
Platelets: 227 10*3/uL (ref 150–400)
RBC: 5.88 MIL/uL — ABNORMAL HIGH (ref 4.22–5.81)
RDW: 13.4 % (ref 11.5–15.5)
WBC: 6.9 10*3/uL (ref 4.0–10.5)
nRBC: 0 % (ref 0.0–0.2)

## 2022-07-15 LAB — TROPONIN I (HIGH SENSITIVITY)
Troponin I (High Sensitivity): 2 ng/L (ref <18)
Troponin I (High Sensitivity): 3 ng/L (ref <18)

## 2022-07-15 MED ORDER — ONDANSETRON HCL 4 MG/2ML IJ SOLN
4.0000 mg | Freq: Once | INTRAMUSCULAR | Status: AC
  Administered 2022-07-15: 4 mg via INTRAVENOUS
  Filled 2022-07-15: qty 2

## 2022-07-15 MED ORDER — SODIUM CHLORIDE 0.9 % IV BOLUS
1000.0000 mL | Freq: Once | INTRAVENOUS | Status: AC
  Administered 2022-07-15: 1000 mL via INTRAVENOUS

## 2022-07-15 MED ORDER — ONDANSETRON HCL 4 MG PO TABS: 4.0000 mg | ORAL_TABLET | Freq: Four times a day (QID) | ORAL | 0 refills | Status: AC

## 2022-07-15 MED ORDER — KETOROLAC TROMETHAMINE 15 MG/ML IJ SOLN
15.0000 mg | Freq: Once | INTRAMUSCULAR | Status: AC
  Administered 2022-07-15: 15 mg via INTRAVENOUS
  Filled 2022-07-15: qty 1

## 2022-07-15 MED ORDER — ACETAMINOPHEN 500 MG PO TABS
1000.0000 mg | ORAL_TABLET | Freq: Once | ORAL | Status: AC
  Administered 2022-07-15: 1000 mg via ORAL
  Filled 2022-07-15: qty 2

## 2022-07-15 NOTE — Discharge Instructions (Signed)
Return for any problem.  ?

## 2022-07-15 NOTE — ED Provider Notes (Signed)
[facility] Provider Note   CSN: [id number] Arrival date & time: 07/15/22  1057     History  Chief Complaint  Patient presents with   Loss of Consciousness    Dylan Thornton. is a 42 y.o. male.  42 year old male with prior medical history as detailed below presents for evaluation.  Patient reports that around 2 AM he began having nausea and vomiting.  Patient reports that after several rounds of vomiting through the night he felt a little bit better and went to work.  While at work he had recurrent episodes of nausea and vomiting.  While sitting in a meeting he had a wave of nausea and then apparently passed out.  Bystanders said that he fell from the chair to the floor.  He may have struck his head.  On arrival to the ED he complains of persistent nausea.  A c-collar was placed by EMS.  He complains of mild posterior neck pain.  He is unsure if the neck pain resulted from his syncopal event and fall or whether it was from straining earlier today while vomiting multiple times.   The history is provided by the patient and medical records.       Home Medications Prior to Admission medications   Medication Sig Start Date End Date Taking? Authorizing Provider  SUMAtriptan (IMITREX) 100 MG tablet Take 1 tablet (100 mg total) by mouth every 2 (two) hours as needed for migraine. May repeat in 2 hours if headache persists or recurs. Patient taking differently: Take 100 mg by mouth every 2 (two) hours as needed for migraine (and may repeat in 2 hours if headache persists or recurs.). 02/18/21  Yes Plotnikov, Evie Lacks, MD  TYLENOL 8 HOUR 650 MG CR tablet Take 1,300 mg by mouth every 8 (eight) hours as needed (for headaches).   Yes [provider]  HYDROcodone bit-homatropine (HYCODAN) 5-1.5 MG/5ML syrup Take 5 mLs by mouth at bedtime as needed for cough. Patient not taking: Reported on 07/15/2022 04/27/22   Horald Pollen, MD   naproxen (NAPROSYN) 500 MG tablet Take 1 tablet (500 mg total) by mouth 2 (two) times daily as needed for headache (headache, take with sumatriptan). Patient not taking: Reported on 07/15/2022 02/18/21   Plotnikov, Evie Lacks, MD  ondansetron (ZOFRAN) 4 MG tablet Take 1 tablet (4 mg total) by mouth every 8 (eight) hours as needed for nausea or vomiting (migraine nausea). Patient not taking: Reported on 07/15/2022 02/18/21   Plotnikov, Evie Lacks, MD  triamcinolone cream (KENALOG) 0.1 % Apply 1 application. topically 2 (two) times daily. Patient not taking: Reported on 07/15/2022 09/12/21   Hoyt Koch, MD      Allergies    Patient has no known allergies.    Review of Systems   Review of Systems  All other systems reviewed and are negative.   Physical Exam Updated Vital Signs BP 132/87   Pulse 93   Temp 98 F (36.7 C) (Oral)   Resp 20   Ht '5\' 6"'$  (1.676 m)   Wt 99.8 kg   SpO2 98%   BMI 35.51 kg/m  Physical Exam Vitals and nursing note reviewed.  Constitutional:      General: He is not in acute distress.    Appearance: Normal appearance. He is well-developed.  HENT:     Head: Normocephalic and atraumatic.  Eyes:     Conjunctiva/sclera: Conjunctivae normal.     Pupils: Pupils are  equal, round, and reactive to light.  Cardiovascular:     Rate and Rhythm: Normal rate and regular rhythm.     Heart sounds: Normal heart sounds.  Pulmonary:     Effort: Pulmonary effort is normal. No respiratory distress.     Breath sounds: Normal breath sounds.  Abdominal:     General: There is no distension.     Palpations: Abdomen is soft.     Tenderness: There is no abdominal tenderness.  Musculoskeletal:        General: No deformity. Normal range of motion.     Cervical back: Normal range of motion and neck supple.  Skin:    General: Skin is warm and dry.  Neurological:     General: No focal deficit present.     Mental Status: He is alert and oriented to person, place, and time.  Mental status is at baseline.     ED Results / Procedures / Treatments   Labs (all labs ordered are listed, but only abnormal results are displayed) Labs Reviewed  CBC WITH DIFFERENTIAL/PLATELET - Abnormal; Notable for the following components:      Result Value   RBC 5.88 (*)    Lymphs Abs 0.5 (*)    All other components within normal limits  COMPREHENSIVE METABOLIC PANEL - Abnormal; Notable for the following components:   Glucose, Bld 139 (*)    Creatinine, Ser 1.33 (*)    Calcium 8.7 (*)    Total Bilirubin 1.4 (*)    All other components within normal limits  TROPONIN I (HIGH SENSITIVITY)    EKG EKG Interpretation  Date/Time:  Wednesday July 15 2022 11:12:46 EST Ventricular Rate:  93 PR Interval:  167 QRS Duration: 92 QT Interval:  331 QTC Calculation: 412 R Axis:   36 Text Interpretation: Sinus rhythm Confirmed by Dene Gentry [phone]) on 07/15/2022 11:18:52 AM  Radiology DG Chest Port 1 View  Result Date: 07/15/2022 CLINICAL DATA:  Syncope EXAM: PORTABLE CHEST 1 VIEW COMPARISON:  X-ray 09/15/2008 FINDINGS: No consolidation, pneumothorax or effusion. No edema. Normal cardiopericardial silhouette without edema when adjusting for technique. Overlapping cardiac leads. IMPRESSION: No acute cardiopulmonary disease Electronically Signed   By: Jill Side M.D.   On: 07/15/2022 12:07    Procedures Procedures    Medications Ordered in ED Medications  sodium chloride 0.9 % bolus 1,000 mL (1,000 mLs Intravenous New Bag/Given 07/15/22 1124)  ondansetron (ZOFRAN) injection 4 mg (4 mg Intravenous Given 07/15/22 1124)    ED Course/ Medical Decision Making/ A&P                             Medical Decision Making Amount and/or Complexity of Data Reviewed Labs: ordered. Radiology: ordered.  Risk OTC drugs. Prescription drug management.    Medical Screen Complete  This patient presented to the ED with complaint of syncope, nausea, vomiting.  This complaint involves an  extensive number of treatment options. The initial differential diagnosis includes, but is not limited to, metabolic abnormality, vasovagal episode, gastroenteritis, etc.  This presentation is: Acute, Self-Limited, Previously Undiagnosed, Uncertain Prognosis, Complicated, Systemic Symptoms, and Threat to Life/Bodily Function  Patient with several hours of nausea and vomiting earlier today and now presents after syncopal event associated with same.  Presentation is most consistent with likely vasovagal episode related to reported nausea and vomiting.  Screening labs obtained are without significant abnormality.  Patient is feeling improved with antiemetics and fluids.  Patient is  reassured by ED workup.  Prior to discharge patient is taking p.o. fluids well.  He understands need for close outpatient follow-up.  Strict return precautions given and understood.  Additional history obtained:  External records from outside sources obtained and reviewed including prior ED visits and prior Inpatient records.    Lab Tests:  I ordered and personally interpreted labs.  The pertinent results include: CBC, CMP, troponin x 2   Imaging Studies ordered:  I ordered imaging studies including chest x-ray, CT head, CT C-spine I independently visualized and interpreted obtained imaging which showed NAD I agree with the radiologist interpretation.   Cardiac Monitoring:  The patient was maintained on a cardiac monitor.  I personally viewed and interpreted the cardiac monitor which showed an underlying rhythm of: NSR   Medicines ordered:  I ordered medication including  IV fluids, Zofran for dehydration, vomiting Reevaluation of the patient after these medicines showed that the patient: improved   Problem List / ED Course:  Nausea, vomiting, syncope   Reevaluation:  After the interventions noted above, I reevaluated the patient and found that they have: improved  Disposition:  After  consideration of the diagnostic results and the patients response to treatment, I feel that the patent would benefit from close outpatient follow-up.          Final Clinical Impression(s) / ED Diagnoses Final diagnoses:  Syncope and collapse  Nausea and vomiting, unspecified vomiting type    Rx / DC Orders ED Discharge Orders          Ordered    ondansetron (ZOFRAN) 4 MG tablet  Every 6 hours        07/15/22 1243              Valarie Merino, MD 07/15/22 1600

## 2022-07-15 NOTE — ED Triage Notes (Signed)
Pt bib EMS. Pt has had 3 syncopal episodes. 1 last night, 1 unwitness today at work and fell in bathroom and then a 3rd today witnessed while sitting in a meeting fell out of chair landing on floor.pt states woke up at 2 am vomiting and then vomited again after 3rd syncopal episode. Pt complains of lower abd pain and nausea. No diarrhea.Pt in ccollar from EMS. Denies any medical hx.

## 2022-07-16 ENCOUNTER — Telehealth: Payer: Self-pay

## 2022-07-16 NOTE — Transitions of Care (Post Inpatient/ED Visit) (Signed)
   07/16/2022  Name: Dylan Thornton. MRN: [id number] DOB: [birth year]  Today's TOC FU Call Status: Today's TOC FU Call Status:: Successful TOC FU Call Competed TOC FU Call Complete Date: 07/16/22  Transition Care Management Follow-up Telephone Call Date of Discharge: 07/15/22 Discharge Facility: [facility]) Type of Discharge: Emergency Department Reason for ED Visit: Other: (syncope) How have you been since you were released from the hospital?: Better Any questions or concerns?: No  Items Reviewed: Did you receive and understand the discharge instructions provided?: Yes Medications obtained and verified?: Yes (Medications Reviewed) Any new allergies since your discharge?: No Dietary orders reviewed?: NA Do you have support at home?: Yes People in Home: spouse  Home Care and Equipment/Supplies: [place] Ordered?: NA Any new equipment or medical supplies ordered?: NA  Functional Questionnaire: Do you need assistance with bathing/showering or dressing?: No Do you need assistance with meal preparation?: No Do you need assistance with eating?: No Do you have difficulty maintaining continence: No Do you need assistance with getting out of bed/getting out of a chair/moving?: No Do you have difficulty managing or taking your medications?: No  Folllow up appointments reviewed: PCP Follow-up appointment confirmed?: No (no avail appt times, sent message to staff to schedule) MD Provider Line Number:[phone] Given: No [facility] Follow-up appointment confirmed?: NA Do you need transportation to your follow-up appointment?: No Do you understand care options if your condition(s) worsen?: Yes-patient verbalized understanding    [place], [place] Nurse Health Advisor Direct Dial [phone]

## 2022-07-20 ENCOUNTER — Emergency Department (HOSPITAL_BASED_OUTPATIENT_CLINIC_OR_DEPARTMENT_OTHER): Payer: BC Managed Care – PPO

## 2022-07-20 ENCOUNTER — Emergency Department (HOSPITAL_BASED_OUTPATIENT_CLINIC_OR_DEPARTMENT_OTHER)
Admission: EM | Admit: 2022-07-20 | Discharge: 2022-07-20 | Disposition: A | Discharge status: Home / Self Care | Payer: BC Managed Care – PPO | Attending: Emergency Medicine | Admitting: Emergency Medicine

## 2022-07-20 ENCOUNTER — Other Ambulatory Visit: Payer: Self-pay

## 2022-07-20 ENCOUNTER — Encounter (HOSPITAL_BASED_OUTPATIENT_CLINIC_OR_DEPARTMENT_OTHER): Payer: Self-pay | Admitting: Emergency Medicine

## 2022-07-20 DIAGNOSIS — R1032 Left lower quadrant pain: Secondary | ICD-10-CM | POA: Diagnosis not present

## 2022-07-20 DIAGNOSIS — R1030 Lower abdominal pain, unspecified: Secondary | ICD-10-CM

## 2022-07-20 LAB — URINALYSIS, ROUTINE W REFLEX MICROSCOPIC
Bilirubin Urine: NEGATIVE
Glucose, UA: NEGATIVE mg/dL
Hgb urine dipstick: NEGATIVE
Ketones, ur: NEGATIVE mg/dL
Leukocytes,Ua: NEGATIVE
Nitrite: NEGATIVE
Protein, ur: NEGATIVE mg/dL
Specific Gravity, Urine: 1.016 (ref 1.005–1.030)
pH: 6 (ref 5.0–8.0)

## 2022-07-20 LAB — LIPASE, BLOOD: Lipase: 23 U/L (ref 11–51)

## 2022-07-20 LAB — CBC
HCT: 47.7 % (ref 39.0–52.0)
Hemoglobin: 16.2 g/dL (ref 13.0–17.0)
MCH: 28.6 pg (ref 26.0–34.0)
MCHC: 34 g/dL (ref 30.0–36.0)
MCV: 84.1 fL (ref 80.0–100.0)
Platelets: 289 10*3/uL (ref 150–400)
RBC: 5.67 MIL/uL (ref 4.22–5.81)
RDW: 13.4 % (ref 11.5–15.5)
WBC: 4.6 10*3/uL (ref 4.0–10.5)
nRBC: 0 % (ref 0.0–0.2)

## 2022-07-20 LAB — COMPREHENSIVE METABOLIC PANEL
ALT: 20 U/L (ref 0–44)
AST: 26 U/L (ref 15–41)
Albumin: 4.4 g/dL (ref 3.5–5.0)
Alkaline Phosphatase: 65 U/L (ref 38–126)
Anion gap: 7 (ref 5–15)
BUN: 17 mg/dL (ref 6–20)
CO2: 27 mmol/L (ref 22–32)
Calcium: 10.2 mg/dL (ref 8.9–10.3)
Chloride: 103 mmol/L (ref 98–111)
Creatinine, Ser: 1.44 mg/dL — ABNORMAL HIGH (ref 0.61–1.24)
GFR, Estimated: >60 mL/min (ref >60)
Glucose, Bld: 86 mg/dL (ref 70–99)
Potassium: 5.5 mmol/L — ABNORMAL HIGH (ref 3.5–5.1)
Sodium: 137 mmol/L (ref 135–145)
Total Bilirubin: 0.8 mg/dL (ref 0.3–1.2)
Total Protein: 7.7 g/dL (ref 6.5–8.1)

## 2022-07-20 MED ORDER — DICYCLOMINE HCL 20 MG PO TABS: 20.0000 mg | ORAL_TABLET | Freq: Two times a day (BID) | ORAL | 0 refills | Status: AC

## 2022-07-20 MED ORDER — IOHEXOL 300 MG/ML  SOLN
100.0000 mL | Freq: Once | INTRAMUSCULAR | Status: AC | PRN
  Administered 2022-07-20: 85 mL via INTRAVENOUS

## 2022-07-20 MED ORDER — FAMOTIDINE 20 MG PO TABS: 20.0000 mg | ORAL_TABLET | Freq: Two times a day (BID) | ORAL | 0 refills | Status: AC

## 2022-07-20 MED ORDER — SODIUM CHLORIDE 0.9 % IV BOLUS
1000.0000 mL | Freq: Once | INTRAVENOUS | Status: AC
  Administered 2022-07-20: 1000 mL via INTRAVENOUS

## 2022-07-20 NOTE — ED Provider Notes (Signed)
[facility] Provider Note   CSN: [id number] Arrival date & time: 07/20/22  1241     History  Chief Complaint  Patient presents with   Abdominal Pain    Dylan Thornton. is a 42 y.o. male.  Patient with no past surgical history presents to the emergency department today for evaluation of lower abdominal pain ongoing over the past 5 days.  Pain is waxing and waning but persistent, favoring the left lower quadrant.  He had an episode of vomiting at onset, none since.  He reports decreased bowel movements since that time.  No fevers, nausea or vomiting.  He feels a bloated, warm feeling in his abdomen.  He denies blood in the stool or urinary symptoms such as dysuria, increased frequency urgency or hematuria.  No treatments prior to arrival.  He has not had pain like this in the past.  Patient was also seen on 07/15/2022 for syncopal episodes.  Patient had extensive evaluation done at that time including blood work including cardiac markers, head CT, EKG.  He was treated for dehydration at that visit.Workup was reassuring.  He is due to follow-up with his doctor in about 1 week.         Home Medications Prior to Admission medications   Medication Sig Start Date End Date Taking? Authorizing Provider  HYDROcodone bit-homatropine (HYCODAN) 5-1.5 MG/5ML syrup Take 5 mLs by mouth at bedtime as needed for cough. Patient not taking: Reported on 07/15/2022 04/27/22   Horald Pollen, MD  naproxen (NAPROSYN) 500 MG tablet Take 1 tablet (500 mg total) by mouth 2 (two) times daily as needed for headache (headache, take with sumatriptan). Patient not taking: Reported on 07/15/2022 02/18/21   Plotnikov, Evie Lacks, MD  ondansetron (ZOFRAN) 4 MG tablet Take 1 tablet (4 mg total) by mouth every 8 (eight) hours as needed for nausea or vomiting (migraine nausea). Patient not taking: Reported on 07/15/2022 02/18/21   Plotnikov, Evie Lacks, MD  ondansetron  (ZOFRAN) 4 MG tablet Take 1 tablet (4 mg total) by mouth every 6 (six) hours. 07/15/22   Valarie Merino, MD  SUMAtriptan (IMITREX) 100 MG tablet Take 1 tablet (100 mg total) by mouth every 2 (two) hours as needed for migraine. May repeat in 2 hours if headache persists or recurs. Patient taking differently: Take 100 mg by mouth every 2 (two) hours as needed for migraine (and may repeat in 2 hours if headache persists or recurs.). 02/18/21   Plotnikov, Evie Lacks, MD  triamcinolone cream (KENALOG) 0.1 % Apply 1 application. topically 2 (two) times daily. Patient not taking: Reported on 07/15/2022 09/12/21   Hoyt Koch, MD  TYLENOL 8 HOUR 650 MG CR tablet Take 1,300 mg by mouth every 8 (eight) hours as needed (for headaches).    [provider]      Allergies    Patient has no known allergies.    Review of Systems   Review of Systems  Physical Exam Updated Vital Signs BP (!) 142/99 (BP Location: Left Arm)   Pulse 81   Temp 98.1 F (36.7 C) (Oral)   Resp 18   SpO2 100%  Physical Exam Vitals and nursing note reviewed.  Constitutional:      General: He is not in acute distress.    Appearance: He is well-developed.  HENT:     Head: Normocephalic and atraumatic.  Eyes:     General:  Right eye: No discharge.        Left eye: No discharge.     Conjunctiva/sclera: Conjunctivae normal.  Cardiovascular:     Rate and Rhythm: Normal rate and regular rhythm.     Heart sounds: Normal heart sounds.  Pulmonary:     Effort: Pulmonary effort is normal.     Breath sounds: Normal breath sounds.  Abdominal:     Palpations: Abdomen is soft.     Tenderness: There is abdominal tenderness (Mild) in the suprapubic area and left lower quadrant. There is no guarding or rebound.  Musculoskeletal:     Cervical back: Normal range of motion and neck supple.  Skin:    General: Skin is warm and dry.  Neurological:     Mental Status: He is alert.     ED Results / Procedures /  Treatments   Labs (all labs ordered are listed, but only abnormal results are displayed) Labs Reviewed  COMPREHENSIVE METABOLIC PANEL - Abnormal; Notable for the following components:      Result Value   Potassium 5.5 (*)    Creatinine, Ser 1.44 (*)    All other components within normal limits  LIPASE, BLOOD  CBC  URINALYSIS, ROUTINE W REFLEX MICROSCOPIC    EKG None  Radiology CT ABDOMEN PELVIS W CONTRAST  Result Date: 07/20/2022 CLINICAL DATA:  Pain and cramping.  Abdominal pain. EXAM: CT ABDOMEN AND PELVIS WITH CONTRAST TECHNIQUE: Multidetector CT imaging of the abdomen and pelvis was performed using the standard protocol following bolus administration of intravenous contrast. RADIATION DOSE REDUCTION: This exam was performed according to the departmental dose-optimization program which includes automated exposure control, adjustment of the mA and/or kV according to patient size and/or use of iterative reconstruction technique. CONTRAST:  3m OMNIPAQUE IOHEXOL 300 MG/ML  SOLN COMPARISON:  None Available. FINDINGS: Lower chest: Lung bases are clear. Hepatobiliary: No focal hepatic lesion. Normal gallbladder. No biliary duct dilatation. Common bile duct is normal. Pancreas: Pancreas is normal. No ductal dilatation. No pancreatic inflammation. Spleen: Normal spleen Adrenals/urinary tract: Adrenal glands and kidneys are normal. The ureters and bladder normal. Stomach/Bowel: Stomach, small bowel, appendix, and cecum are normal. The colon and rectosigmoid colon are normal. Vascular/Lymphatic: Abdominal aorta is normal caliber. No periportal or retroperitoneal adenopathy. No pelvic adenopathy. Reproductive: Prostate unremarkable Other: No free fluid. Musculoskeletal: No aggressive osseous lesion. IMPRESSION: 1. No acute findings in the abdomen pelvis. 2. Normal appendix. 3. Normal gallbladder. Electronically Signed   By: SSuzy BouchardM.D.   On: 07/20/2022 17:01    Procedures Procedures     Medications Ordered in ED Medications - No data to display  ED Course/ Medical Decision Making/ A&P    Patient seen and examined. History obtained directly from patient. Work-up including labs, imaging, EKG ordered in triage, if performed, were reviewed.    Labs/EKG: Independently reviewed and interpreted.  This included: CBC with normal white blood cell count, normal hemoglobin; CMP with mild hyperkalemia at 3.5, creatinine 1.44; lipase normal; UA negative.  Imaging: Ordered CT abdomen pelvis  Medications/Fluids: Ordered: Fluid bolus   Most recent vital signs reviewed and are as follows: BP (!) 142/99 (BP Location: Left Arm)   Pulse 81   Temp 98.1 F (36.7 C) (Oral)   Resp 18   SpO2 100%   Initial impression: Left lower quadrant abdominal pain  5:32 PM Reassessment performed. Patient appears stable.  On reexam, exam unchanged.  No rebound or guarding.  Imaging personally visualized and interpreted including: CT of  the abdomen and pelvis, agree no acute findings.  Reviewed pertinent lab work and imaging with patient at bedside. Questions answered.   Most current vital signs reviewed and are as follows: BP (!) 146/97   Pulse 72   Temp 98.1 F (36.7 C) (Oral)   Resp 18   SpO2 99%   Plan: Discharge to home.   Prescriptions written for: Pepcid, Bentyl  Other home care instructions discussed: Avoidance of triggers, bland diet  ED return instructions discussed: The patient was urged to return to the Emergency Department immediately with worsening of current symptoms, worsening abdominal pain, persistent vomiting, blood noted in stools, fever, or any other concerns. The patient verbalized understanding.   Follow-up instructions discussed: Patient encouraged to follow-up with their PCP in 5-7 days.                               Medical Decision Making Amount and/or Complexity of Data Reviewed Labs: ordered. Radiology: ordered.   For this patient's complaint of  abdominal pain, the following conditions were considered on the differential diagnosis: gastritis/PUD, enteritis/duodenitis, appendicitis, cholelithiasis/cholecystitis, cholangitis, pancreatitis, ruptured viscus, colitis, diverticulitis, small/large bowel obstruction, proctitis, cystitis, pyelonephritis, ureteral colic, aortic dissection, aortic aneurysm. Atypical chest etiologies were also considered including ACS, PE, and pneumonia.  The patient's vital signs, pertinent lab work and imaging were reviewed and interpreted as discussed in the ED course. Hospitalization was considered for further testing, treatments, or serial exams/observation. However as patient is well-appearing, has a stable exam, and reassuring studies today, I do not feel that they warrant admission at this time. This plan was discussed with the patient who verbalizes agreement and comfort with this plan and seems reliable and able to return to the Emergency Department with worsening or changing symptoms.          Final Clinical Impression(s) / ED Diagnoses Final diagnoses:  Lower abdominal pain    Rx / DC Orders ED Discharge Orders          Ordered    famotidine (PEPCID) 20 MG tablet  2 times daily        07/20/22 1729    dicyclomine (BENTYL) 20 MG tablet  2 times daily        07/20/22 1729              Carlisle Cater, PA-C 07/20/22 1735    Malvin Johns, MD 07/20/22 1931

## 2022-07-20 NOTE — ED Notes (Signed)
Patient verbalizes understanding of discharge instructions. Opportunity for questioning and answers were provided. Patient discharged from ED.  °

## 2022-07-20 NOTE — ED Triage Notes (Signed)
Pt arrives to ED with c/o generalized lower abdominal pain that started on 3/6. Aggravating factors include eating. He describes the pain and cramping and burning. Not associated with diarrhea, vomiting, constipation.

## 2022-07-20 NOTE — Discharge Instructions (Signed)
Please read and follow all provided instructions.  Your diagnoses today include:  1. Lower abdominal pain     Tests performed today include: Blood cell counts and platelets: normal Kidney and liver function tests: potassium was high Pancreas function test (called lipase): was normal Urine test to look for infection: no infection CT of the abdomen pelvis: No signs of infection or other problems Vital signs. See below for your results today.   Medications prescribed:  Pepcid (famotidine) - antihistamine  You can find this medication over-the-counter.   DO NOT exceed:  '20mg'$  Pepcid every 12 hours  Omeprazole (Prilosec) - stomach acid reducer  This medication can be found over-the-counter  Take any prescribed medications only as directed.  Home care instructions:  Follow any educational materials contained in this packet.  Follow-up instructions: Please follow-up with your primary care provider in the next 5-7 days for further evaluation of your symptoms.    Return instructions:  SEEK IMMEDIATE MEDICAL ATTENTION IF: The pain does not go away or becomes severe  A temperature above 101F develops  Repeated vomiting occurs (multiple episodes)  The pain becomes localized to portions of the abdomen. The right side could possibly be appendicitis. In an adult, the left lower portion of the abdomen could be colitis or diverticulitis.  Blood is being passed in stools or vomit (bright red or black tarry stools)  You develop chest pain, difficulty breathing, dizziness or fainting, or become confused, poorly responsive, or inconsolable (young children) If you have any other emergent concerns regarding your health  Additional Information: Abdominal (belly) pain can be caused by many things. Your caregiver performed an examination and possibly ordered blood/urine tests and imaging (CT scan, x-rays, ultrasound). Many cases can be observed and treated at home after initial evaluation in the  emergency department. Even though you are being discharged home, abdominal pain can be unpredictable. Therefore, you need a repeated exam if your pain does not resolve, returns, or worsens. Most patients with abdominal pain don't have to be admitted to the hospital or have surgery, but serious problems like appendicitis and gallbladder attacks can start out as nonspecific pain. Many abdominal conditions cannot be diagnosed in one visit, so follow-up evaluations are very important.  Your vital signs today were: BP (!) 146/97   Pulse 72   Temp 98.1 F (36.7 C) (Oral)   Resp 18   SpO2 99%  If your blood pressure (bp) was elevated above 135/85 this visit, please have this repeated by your doctor within one month. --------------

## 2022-07-21 ENCOUNTER — Telehealth: Payer: Self-pay

## 2022-07-21 NOTE — Transitions of Care (Post Inpatient/ED Visit) (Signed)
   07/21/2022  Name: Dylan Thornton. MRN: [id number] DOB: March 05, 1981  Today's TOC FU Call Status: Today's TOC FU Call Status:: Successful TOC FU Call Competed TOC FU Call Complete Date: 07/21/22  Transition Care Management Follow-up Telephone Call Date of Discharge: 07/20/22 Discharge Facility: [facility] ([facility]) Type of Discharge: Emergency Department Reason for ED Visit: Other: (lower abd pain) How have you been since you were released from the hospital?: Better Any questions or concerns?: No  Items Reviewed: Did you receive and understand the discharge instructions provided?: Yes Medications obtained and verified?: Yes (Medications Reviewed) Any new allergies since your discharge?: No Dietary orders reviewed?: Yes Do you have support at home?: Yes People in Home: spouse  Home Care and Equipment/Supplies: [place] Ordered?: NA Any new equipment or medical supplies ordered?: NA  Functional Questionnaire: Do you need assistance with bathing/showering or dressing?: No Do you need assistance with meal preparation?: No Do you need assistance with eating?: No Do you have difficulty maintaining continence: No Do you need assistance with getting out of bed/getting out of a chair/moving?: No Do you have difficulty managing or taking your medications?: No  Folllow up appointments reviewed: PCP Follow-up appointment confirmed?: Yes Date of PCP follow-up appointment?: 07/27/22 Follow-up Provider: [facility] Follow-up appointment confirmed?: NA Do you need transportation to your follow-up appointment?: No Do you understand care options if your condition(s) worsen?: Yes-patient verbalized understanding    [place], [place] Direct Dial [phone]

## 2022-07-27 ENCOUNTER — Encounter: Payer: Self-pay | Admitting: Internal Medicine

## 2022-07-27 ENCOUNTER — Ambulatory Visit: Payer: BC Managed Care – PPO | Admitting: Internal Medicine

## 2022-07-27 VITALS — BP 130/110 | HR 65 | Temp 98.6°F | Ht 66.0 in | Wt 223.0 lb

## 2022-07-27 DIAGNOSIS — R112 Nausea with vomiting, unspecified: Secondary | ICD-10-CM | POA: Diagnosis not present

## 2022-07-27 DIAGNOSIS — R5383 Other fatigue: Secondary | ICD-10-CM

## 2022-07-27 DIAGNOSIS — R1084 Generalized abdominal pain: Secondary | ICD-10-CM | POA: Diagnosis not present

## 2022-07-27 LAB — COMPREHENSIVE METABOLIC PANEL
ALT: 23 U/L (ref 0–53)
AST: 22 U/L (ref 0–37)
Albumin: 4.6 g/dL (ref 3.5–5.2)
Alkaline Phosphatase: 69 U/L (ref 39–117)
BUN: 14 mg/dL (ref 6–23)
CO2: 28 mEq/L (ref 19–32)
Calcium: 10 mg/dL (ref 8.4–10.5)
Chloride: 101 mEq/L (ref 96–112)
Creatinine, Ser: 1.43 mg/dL (ref 0.40–1.50)
GFR: 61.77 mL/min (ref >60.00)
Glucose, Bld: 78 mg/dL (ref 70–99)
Potassium: 4.9 mEq/L (ref 3.5–5.1)
Sodium: 138 mEq/L (ref 135–145)
Total Bilirubin: 1.1 mg/dL (ref 0.2–1.2)
Total Protein: 7.5 g/dL (ref 6.0–8.3)

## 2022-07-27 LAB — CORTISOL: Cortisol, Plasma: 5.6 ug/dL

## 2022-07-27 LAB — TESTOSTERONE: Testosterone: 539 ng/dL (ref 300.00–890.00)

## 2022-07-27 LAB — T4, FREE: Free T4: 0.55 ng/dL — ABNORMAL LOW (ref 0.60–1.60)

## 2022-07-27 LAB — SEDIMENTATION RATE: Sed Rate: 5 mm/hr (ref 0–15)

## 2022-07-27 LAB — VITAMIN B12: Vitamin B-12: 277 pg/mL (ref 211–911)

## 2022-07-27 LAB — VITAMIN D 25 HYDROXY (VIT D DEFICIENCY, FRACTURES): VITD: 23.58 ng/mL — ABNORMAL LOW (ref 30.00–100.00)

## 2022-07-27 MED ORDER — ALIGN 4 MG PO CAPS: 1.0000 | ORAL_CAPSULE | Freq: Every day | ORAL | 1 refills | Status: AC

## 2022-07-27 MED ORDER — METRONIDAZOLE 500 MG PO TABS: 500.0000 mg | ORAL_TABLET | Freq: Three times a day (TID) | ORAL | 0 refills | Status: AC

## 2022-07-27 NOTE — Progress Notes (Signed)
Subjective:  Patient ID: Dylan Found., male    DOB: Sep 09, 1980  Age: 42 y.o. MRN: [id number]  CC: No chief complaint on file.   HPI Dylan Scalone [place] Brooke [place]. presents for syncope x 2 on 07/15/22 C/o lower abd cramping and fatigue  w/HAs since 3/6/2-24 Lower abd is burning; stool - qd F/u HA C/o fatigue x 6 months  Outpatient Medications Prior to Visit  Medication Sig Dispense Refill   dicyclomine (BENTYL) 20 MG tablet Take 1 tablet (20 mg total) by mouth 2 (two) times daily. 20 tablet 0   famotidine (PEPCID) 20 MG tablet Take 1 tablet (20 mg total) by mouth 2 (two) times daily. 15 tablet 0   ondansetron (ZOFRAN) 4 MG tablet Take 1 tablet (4 mg total) by mouth every 6 (six) hours. 12 tablet 0   SUMAtriptan (IMITREX) 100 MG tablet Take 1 tablet (100 mg total) by mouth every 2 (two) hours as needed for migraine. May repeat in 2 hours if headache persists or recurs. (Patient taking differently: Take 100 mg by mouth every 2 (two) hours as needed for migraine (and may repeat in 2 hours if headache persists or recurs.).) 12 tablet 4   TYLENOL 8 HOUR 650 MG CR tablet Take 1,300 mg by mouth every 8 (eight) hours as needed (for headaches).     No facility-administered medications prior to visit.    ROS: Review of Systems  Constitutional:  Positive for fatigue. Negative for appetite change and unexpected weight change.  HENT:  Negative for congestion, nosebleeds, sneezing, sore throat and trouble swallowing.   Eyes:  Negative for itching and visual disturbance.  Respiratory:  Negative for cough.   Cardiovascular:  Negative for chest pain, palpitations and leg swelling.  Gastrointestinal:  Positive for abdominal pain. Negative for abdominal distention, blood in stool, diarrhea and nausea.  Genitourinary:  Negative for frequency and hematuria.  Musculoskeletal:  Negative for back pain, gait problem, joint swelling and neck pain.  Skin:  Negative for color change and rash.  Neurological:   Negative for dizziness, tremors, speech difficulty and weakness.  Hematological:  Does not bruise/bleed easily.  Psychiatric/Behavioral:  Negative for agitation, dysphoric mood, sleep disturbance and suicidal ideas. The patient is not nervous/anxious.     Objective:  BP (!) 130/110 (BP Location: Left Arm, Patient Position: Sitting, Cuff Size: Normal)   Pulse 65   Temp 98.6 F (37 C) (Oral)   Ht 5\' 6"  (1.676 m)   Wt 223 lb (101.2 kg)   SpO2 97%   BMI 35.99 kg/m   BP Readings from Last 3 Encounters:  07/27/22 (!) 130/110  07/20/22 (!) 129/92  07/15/22 102/70    Wt Readings from Last 3 Encounters:  07/27/22 223 lb (101.2 kg)  07/15/22 220 lb (99.8 kg)  06/09/22 227 lb 8 oz (103.2 kg)    Physical Exam Constitutional:      General: He is not in acute distress.    Appearance: Normal appearance. He is well-developed.     Comments: NAD  Eyes:     Conjunctiva/sclera: Conjunctivae normal.     Pupils: Pupils are equal, round, and reactive to light.  Neck:     Thyroid: No thyromegaly.     Vascular: No JVD.  Cardiovascular:     Rate and Rhythm: Normal rate and regular rhythm.     Heart sounds: Normal heart sounds. No murmur heard.    No friction rub. No gallop.  Pulmonary:     Effort: Pulmonary  effort is normal. No respiratory distress.     Breath sounds: Normal breath sounds. No wheezing or rales.  Chest:     Chest wall: No tenderness.  Abdominal:     General: Bowel sounds are normal. There is no distension.     Palpations: Abdomen is soft. There is no mass.     Tenderness: There is no abdominal tenderness. There is no guarding or rebound.  Musculoskeletal:        General: No tenderness. Normal range of motion.     Cervical back: Normal range of motion.  Lymphadenopathy:     Cervical: No cervical adenopathy.  Skin:    General: Skin is warm and dry.     Findings: No rash.  Neurological:     Mental Status: He is alert and oriented to person, place, and time.      Cranial Nerves: No cranial nerve deficit.     Motor: No abnormal muscle tone.     Coordination: Coordination normal.     Gait: Gait normal.     Deep Tendon Reflexes: Reflexes are normal and symmetric.  Psychiatric:        Behavior: Behavior normal.        Thought Content: Thought content normal.        Judgment: Judgment normal.      A total time of 45 minutes was spent preparing to see the patient, reviewing tests, x-rays, operative reports and other medical records.  Also, obtaining history and performing comprehensive physical exam.  Additionally, counseling the patient and wife on the speakerphone regarding the above listed issues - fatigue, syncope, abd pain.   Finally, documenting clinical information in the health records, coordination of care, educating the patient. It is a complex case.   Lab Results  Component Value Date   WBC 4.6 07/20/2022   HGB 16.2 07/20/2022   HCT 47.7 07/20/2022   PLT 289 07/20/2022   GLUCOSE 86 07/20/2022   CHOL 174 09/24/2017   TRIG 88.0 09/24/2017   HDL 37.40 (L) 09/24/2017   LDLCALC 119 (H) 09/24/2017   ALT 20 07/20/2022   AST 26 07/20/2022   NA 137 07/20/2022   K 5.5 (H) 07/20/2022   CL 103 07/20/2022   CREATININE 1.44 (H) 07/20/2022   BUN 17 07/20/2022   CO2 27 07/20/2022   TSH 0.98 06/05/2020    CT ABDOMEN PELVIS W CONTRAST  Result Date: 07/20/2022 CLINICAL DATA:  Pain and cramping.  Abdominal pain. EXAM: CT ABDOMEN AND PELVIS WITH CONTRAST TECHNIQUE: Multidetector CT imaging of the abdomen and pelvis was performed using the standard protocol following bolus administration of intravenous contrast. RADIATION DOSE REDUCTION: This exam was performed according to the departmental dose-optimization program which includes automated exposure control, adjustment of the mA and/or kV according to patient size and/or use of iterative reconstruction technique. CONTRAST:  47mL OMNIPAQUE IOHEXOL 300 MG/ML  SOLN COMPARISON:  None Available. FINDINGS:  Lower chest: Lung bases are clear. Hepatobiliary: No focal hepatic lesion. Normal gallbladder. No biliary duct dilatation. Common bile duct is normal. Pancreas: Pancreas is normal. No ductal dilatation. No pancreatic inflammation. Spleen: Normal spleen Adrenals/urinary tract: Adrenal glands and kidneys are normal. The ureters and bladder normal. Stomach/Bowel: Stomach, small bowel, appendix, and cecum are normal. The colon and rectosigmoid colon are normal. Vascular/Lymphatic: Abdominal aorta is normal caliber. No periportal or retroperitoneal adenopathy. No pelvic adenopathy. Reproductive: Prostate unremarkable Other: No free fluid. Musculoskeletal: No aggressive osseous lesion. IMPRESSION: 1. No acute findings in the abdomen pelvis.  2. Normal appendix. 3. Normal gallbladder. Electronically Signed   By: Suzy Bouchard M.D.   On: 07/20/2022 17:01    Assessment & Plan:   Problem List Items Addressed This Visit       Digestive   Nausea and vomiting    No recent relapse      Relevant Orders   Comprehensive metabolic panel   Vitamin 123456   Cortisol   T4, free   Testosterone   VITAMIN D 25 Hydroxy (Vit-D Deficiency, Fractures)   Sedimentation rate     Other   Other fatigue    Chronic fatigue x 6 months See lab orders ?post-COVD - no documented COVID 6 mo ago; r/o homonal or vitamin deficiency      Relevant Orders   Comprehensive metabolic panel   Vitamin 123456   Cortisol   T4, free   Testosterone   VITAMIN D 25 Hydroxy (Vit-D Deficiency, Fractures)   Sedimentation rate   Generalized abdominal pain - Primary    Lower abd burning ?etiology Abd CT was OK See lab orders Given Flagyl x 10 d, Align po GI ref if needed      Relevant Orders   Comprehensive metabolic panel   Vitamin 123456   Cortisol   T4, free   Testosterone   VITAMIN D 25 Hydroxy (Vit-D Deficiency, Fractures)   Sedimentation rate      Meds ordered this encounter  Medications   Probiotic Product (ALIGN) 4 MG  CAPS    Sig: Take 1 capsule (4 mg total) by mouth daily.    Dispense:  30 capsule    Refill:  1   metroNIDAZOLE (FLAGYL) 500 MG tablet    Sig: Take 1 tablet (500 mg total) by mouth 3 (three) times daily.    Dispense:  30 tablet    Refill:  0      Follow-up: Return in about 4 weeks (around 08/24/2022) for a follow-up visit.  Walker Kehr, MD

## 2022-07-27 NOTE — Assessment & Plan Note (Signed)
Chronic fatigue x 6 months See lab orders ?post-COVD - no documented COVID 6 mo ago; r/o homonal or vitamin deficiency

## 2022-07-27 NOTE — Assessment & Plan Note (Signed)
No recent relapse 

## 2022-07-27 NOTE — Assessment & Plan Note (Addendum)
Lower abd burning ?etiology Abd CT was OK See lab orders Given Flagyl x 10 d, Align po GI ref if needed

## 2022-07-28 MED ORDER — VITAMIN D (ERGOCALCIFEROL) 1.25 MG (50000 UNIT) PO CAPS: 50000.0000 [IU] | ORAL_CAPSULE | ORAL | 0 refills | Status: AC

## 2022-07-28 MED ORDER — VITAMIN D3 50 MCG (2000 UT) PO CAPS: 2000.0000 [IU] | ORAL_CAPSULE | Freq: Every day | ORAL | 3 refills | Status: AC

## 2022-07-28 MED ORDER — B COMPLEX PLUS PO TABS: 1.0000 | ORAL_TABLET | Freq: Every day | ORAL | 3 refills | Status: AC

## 2022-07-28 MED ORDER — LEVOTHYROXINE SODIUM 25 MCG PO TABS: 25.0000 ug | ORAL_TABLET | Freq: Every day | ORAL | 5 refills | Status: AC

## 2022-07-28 NOTE — Addendum Note (Signed)
Addended by: Cassandria Anger on: 07/28/2022 08:08 AM   Modules accepted: Orders, Level of Service

## 2022-08-05 ENCOUNTER — Encounter: Payer: Self-pay | Admitting: Internal Medicine

## 2022-08-05 ENCOUNTER — Ambulatory Visit (INDEPENDENT_AMBULATORY_CARE_PROVIDER_SITE_OTHER): Payer: BC Managed Care – PPO | Admitting: Internal Medicine

## 2022-08-05 VITALS — BP 110/80 | HR 85 | Temp 98.6°F | Ht 66.0 in | Wt 223.0 lb

## 2022-08-05 DIAGNOSIS — E538 Deficiency of other specified B group vitamins: Secondary | ICD-10-CM | POA: Insufficient documentation

## 2022-08-05 DIAGNOSIS — E559 Vitamin D deficiency, unspecified: Secondary | ICD-10-CM | POA: Insufficient documentation

## 2022-08-05 DIAGNOSIS — Z0000 Encounter for general adult medical examination without abnormal findings: Secondary | ICD-10-CM | POA: Diagnosis not present

## 2022-08-05 DIAGNOSIS — E039 Hypothyroidism, unspecified: Secondary | ICD-10-CM | POA: Insufficient documentation

## 2022-08-05 NOTE — Assessment & Plan Note (Signed)

## 2022-08-05 NOTE — Assessment & Plan Note (Signed)
On Vit D 

## 2022-08-05 NOTE — Progress Notes (Signed)
Subjective:  Patient ID: Assunta Found., male    DOB: [birth year]  Age: 42 y.o. MRN: [id number]  CC: No chief complaint on file.   HPI [employer]. presents for well exam F/u hypothyroidism, B12 and Vit D def Fatigue is better Feeling much better  Outpatient Medications Prior to Visit  Medication Sig Dispense Refill   B Complex-Folic Acid (B COMPLEX PLUS) TABS Take 1 tablet by mouth daily. 100 tablet 3   Cholecalciferol (VITAMIN D3) 50 MCG (2000 UT) CAPS Take 1 capsule (2,000 Units total) by mouth daily. 100 capsule 3   dicyclomine (BENTYL) 20 MG tablet Take 1 tablet (20 mg total) by mouth 2 (two) times daily. 20 tablet 0   famotidine (PEPCID) 20 MG tablet Take 1 tablet (20 mg total) by mouth 2 (two) times daily. 15 tablet 0   levothyroxine (SYNTHROID) 25 MCG tablet Take 1 tablet (25 mcg total) by mouth daily. 30 tablet 5   metroNIDAZOLE (FLAGYL) 500 MG tablet Take 1 tablet (500 mg total) by mouth 3 (three) times daily. 30 tablet 0   ondansetron (ZOFRAN) 4 MG tablet Take 1 tablet (4 mg total) by mouth every 6 (six) hours. 12 tablet 0   Probiotic Product (ALIGN) 4 MG CAPS Take 1 capsule (4 mg total) by mouth daily. 30 capsule 1   SUMAtriptan (IMITREX) 100 MG tablet Take 1 tablet (100 mg total) by mouth every 2 (two) hours as needed for migraine. May repeat in 2 hours if headache persists or recurs. (Patient taking differently: Take 100 mg by mouth every 2 (two) hours as needed for migraine (and may repeat in 2 hours if headache persists or recurs.).) 12 tablet 4   TYLENOL 8 HOUR 650 MG CR tablet Take 1,300 mg by mouth every 8 (eight) hours as needed (for headaches).     Vitamin D, Ergocalciferol, (DRISDOL) 1.25 MG (50000 UNIT) CAPS capsule Take 1 capsule (50,000 Units total) by mouth every 7 (seven) days. 8 capsule 0   No facility-administered medications prior to visit.    ROS: Review of Systems  Constitutional:  Negative for appetite change, fatigue and unexpected weight  change.  HENT:  Negative for congestion, nosebleeds, sneezing, sore throat and trouble swallowing.   Eyes:  Negative for itching and visual disturbance.  Respiratory:  Negative for cough.   Cardiovascular:  Negative for chest pain, palpitations and leg swelling.  Gastrointestinal:  Negative for abdominal distention, blood in stool, diarrhea and nausea.  Genitourinary:  Negative for frequency and hematuria.  Musculoskeletal:  Negative for back pain, gait problem, joint swelling and neck pain.  Skin:  Negative for rash.  Neurological:  Negative for dizziness, tremors, speech difficulty and weakness.  Psychiatric/Behavioral:  Negative for agitation, dysphoric mood and sleep disturbance. The patient is not nervous/anxious.     Objective:  BP 110/80 (BP Location: Right Arm, Patient Position: Sitting, Cuff Size: Normal)   Pulse 85   Temp 98.6 F (37 C) (Oral)   Ht 5\' 6"  (1.676 m)   Wt 223 lb (101.2 kg)   SpO2 96%   BMI 35.99 kg/m   BP Readings from Last 3 Encounters:  08/05/22 110/80  07/27/22 (!) 130/110  07/20/22 (!) 129/92    Wt Readings from Last 3 Encounters:  08/05/22 223 lb (101.2 kg)  07/27/22 223 lb (101.2 kg)  07/15/22 220 lb (99.8 kg)    Physical Exam Constitutional:      General: He is not in acute distress.  Appearance: Normal appearance. He is well-developed.     Comments: NAD  Eyes:     Conjunctiva/sclera: Conjunctivae normal.     Pupils: Pupils are equal, round, and reactive to light.  Neck:     Thyroid: No thyromegaly.     Vascular: No JVD.  Cardiovascular:     Rate and Rhythm: Normal rate and regular rhythm.     Heart sounds: Normal heart sounds. No murmur heard.    No friction rub. No gallop.  Pulmonary:     Effort: Pulmonary effort is normal. No respiratory distress.     Breath sounds: Normal breath sounds. No wheezing or rales.  Chest:     Chest wall: No tenderness.  Abdominal:     General: Bowel sounds are normal. There is no distension.      Palpations: Abdomen is soft. There is no mass.     Tenderness: There is no abdominal tenderness. There is no guarding or rebound.  Musculoskeletal:        General: No tenderness. Normal range of motion.     Cervical back: Normal range of motion.  Lymphadenopathy:     Cervical: No cervical adenopathy.  Skin:    General: Skin is warm and dry.     Findings: No rash.  Neurological:     Mental Status: He is alert and oriented to person, place, and time.     Cranial Nerves: No cranial nerve deficit.     Motor: No abnormal muscle tone.     Coordination: Coordination normal.     Gait: Gait normal.     Deep Tendon Reflexes: Reflexes are normal and symmetric.  Psychiatric:        Behavior: Behavior normal.        Thought Content: Thought content normal.        Judgment: Judgment normal.   Testes - self exam  Lab Results  Component Value Date   WBC 4.6 07/20/2022   HGB 16.2 07/20/2022   HCT 47.7 07/20/2022   PLT 289 07/20/2022   GLUCOSE 78 07/27/2022   CHOL 174 09/24/2017   TRIG 88.0 09/24/2017   HDL 37.40 (L) 09/24/2017   LDLCALC 119 (H) 09/24/2017   ALT 23 07/27/2022   AST 22 07/27/2022   NA 138 07/27/2022   K 4.9 07/27/2022   CL 101 07/27/2022   CREATININE 1.43 07/27/2022   BUN 14 07/27/2022   CO2 28 07/27/2022   TSH 0.98 06/05/2020    CT ABDOMEN PELVIS W CONTRAST  Result Date: 07/20/2022 CLINICAL DATA:  Pain and cramping.  Abdominal pain. EXAM: CT ABDOMEN AND PELVIS WITH CONTRAST TECHNIQUE: Multidetector CT imaging of the abdomen and pelvis was performed using the standard protocol following bolus administration of intravenous contrast. RADIATION DOSE REDUCTION: This exam was performed according to the departmental dose-optimization program which includes automated exposure control, adjustment of the mA and/or kV according to patient size and/or use of iterative reconstruction technique. CONTRAST:  81mL OMNIPAQUE IOHEXOL 300 MG/ML  SOLN COMPARISON:  None Available. FINDINGS:  Lower chest: Lung bases are clear. Hepatobiliary: No focal hepatic lesion. Normal gallbladder. No biliary duct dilatation. Common bile duct is normal. Pancreas: Pancreas is normal. No ductal dilatation. No pancreatic inflammation. Spleen: Normal spleen Adrenals/urinary tract: Adrenal glands and kidneys are normal. The ureters and bladder normal. Stomach/Bowel: Stomach, small bowel, appendix, and cecum are normal. The colon and rectosigmoid colon are normal. Vascular/Lymphatic: Abdominal aorta is normal caliber. No periportal or retroperitoneal adenopathy. No pelvic adenopathy. Reproductive: Prostate unremarkable  Other: No free fluid. Musculoskeletal: No aggressive osseous lesion. IMPRESSION: 1. No acute findings in the abdomen pelvis. 2. Normal appendix. 3. Normal gallbladder. Electronically Signed   By: Suzy Bouchard M.D.   On: 07/20/2022 17:01    Assessment & Plan:   Problem List Items Addressed This Visit       Endocrine   Hypothyroidism     On low dose Levothroid      Relevant Orders   TSH   T4, free     Other   Low serum vitamin B12    Borderline B12 On B complex      Relevant Orders   Vitamin B12   Vitamin D deficiency    On Vit D      Relevant Orders   VITAMIN D 25 Hydroxy (Vit-D Deficiency, Fractures)   Well adult exam - Primary     We discussed age appropriate health related issues, including available/recomended screening tests and vaccinations. Labs were ordered to be later reviewed . All questions were answered. We discussed one or more of the following - seat belt use, use of sunscreen/sun exposure exercise, fall risk reduction, second hand smoke exposure, firearm use and storage, seat belt use, a need for adhering to healthy diet and exercise. Labs were ordered.  All questions were answered. Testes - self exam          No orders of the defined types were placed in this encounter.     Follow-up: Return in about 3 months (around 11/05/2022) for a follow-up  visit.  Walker Kehr, MD

## 2022-08-05 NOTE — Assessment & Plan Note (Signed)
Borderline B12 On B complex

## 2022-08-05 NOTE — Assessment & Plan Note (Signed)
  On low dose Levothroid

## 2022-08-25 ENCOUNTER — Telehealth: Payer: Self-pay | Admitting: Internal Medicine

## 2022-08-25 DIAGNOSIS — E538 Deficiency of other specified B group vitamins: Secondary | ICD-10-CM

## 2022-08-25 DIAGNOSIS — R1084 Generalized abdominal pain: Secondary | ICD-10-CM

## 2022-08-25 NOTE — Telephone Encounter (Signed)
Patient would like a referral to a gastroenterologist.  Patient states that he has already discussed this with Dr. Posey Rea - stomach pain has gotten sever and patient is doubling up on the medication and it is still not helping  Please call patient at :  [phone] (patient's wife) when referral is made

## 2022-08-26 NOTE — Telephone Encounter (Signed)
Okay.  Thanks.

## 2022-09-02 NOTE — Progress Notes (Signed)
[name] [address] [place] [address] Phone: [phone] Subjective:   Dylan Thornton, am serving as a scribe for Dr. Antoine Primas. I'm seeing this patient by the request  of:  Plotnikov, Georgina Quint, MD  CC: Right shoulder pain   [id number]  11/04/2021 Patient is significantly better after the injection at this time.  Discussed with patient that he does need to limit certain range of motion otherwise we will continue to have increasing impingement.  Patient will follow-up with me again as needed.      Update 09/03/2022 Dylan Thornton. is a 42 y.o. male coming in with complaint of R AC jt pain. Patient states that R shoulder pain feels similar to last visit. Pain began one month ago over superior aspect. Injection was helpful. Had some pain at night while lying shoulder. Denies any numbness and tingling. Will have sharp pain with fast movement.       No past medical history on file. No past surgical history on file. Social History   Socioeconomic History   Marital status: Married    Spouse name: Not on file   Number of children: 0   Years of education: 12   Highest education level: Not on file  Occupational History   Occupation: Sport and exercise psychologist  Tobacco Use   Smoking status: Never   Smokeless tobacco: Never  Substance and Sexual Activity   Alcohol use: Yes    Comment: Socially   Drug use: No   Sexual activity: Not on file  Other Topics Concern   Not on file  Social History Narrative   Fun: Sleep    Social Determinants of Health   Financial Resource Strain: Not on file  Food Insecurity: Not on file  Transportation Needs: Not on file  Physical Activity: Not on file  Stress: Not on file  Social Connections: Not on file   No Known Allergies Family History  Problem Relation Age of Onset   Hypertension Mother    Gout Father     Current Outpatient Medications (Endocrine & Metabolic):    levothyroxine  (SYNTHROID) 25 MCG tablet, Take 1 tablet (25 mcg total) by mouth daily.    Current Outpatient Medications (Analgesics):    SUMAtriptan (IMITREX) 100 MG tablet, Take 1 tablet (100 mg total) by mouth every 2 (two) hours as needed for migraine. May repeat in 2 hours if headache persists or recurs. (Patient taking differently: Take 100 mg by mouth every 2 (two) hours as needed for migraine (and may repeat in 2 hours if headache persists or recurs.).)   TYLENOL 8 HOUR 650 MG CR tablet, Take 1,300 mg by mouth every 8 (eight) hours as needed (for headaches).   Current Outpatient Medications (Other):    B Complex-Folic Acid (B COMPLEX PLUS) TABS, Take 1 tablet by mouth daily.   Cholecalciferol (VITAMIN D3) 50 MCG (2000 UT) CAPS, Take 1 capsule (2,000 Units total) by mouth daily.   dicyclomine (BENTYL) 20 MG tablet, Take 1 tablet (20 mg total) by mouth 2 (two) times daily.   famotidine (PEPCID) 20 MG tablet, Take 1 tablet (20 mg total) by mouth 2 (two) times daily.   metroNIDAZOLE (FLAGYL) 500 MG tablet, Take 1 tablet (500 mg total) by mouth 3 (three) times daily.   ondansetron (ZOFRAN) 4 MG tablet, Take 1 tablet (4 mg total) by mouth every 6 (six) hours.   Probiotic Product (ALIGN) 4 MG CAPS, Take 1 capsule (4 mg total) by  mouth daily.   Vitamin D, Ergocalciferol, (DRISDOL) 1.25 MG (50000 UNIT) CAPS capsule, Take 1 capsule (50,000 Units total) by mouth every 7 (seven) days.   Reviewed prior external information including notes and imaging from  primary care provider As well as notes that were available from care everywhere and other healthcare systems.  Past medical history, social, surgical and family history all reviewed in electronic medical record.  No pertanent information unless stated regarding to the chief complaint.   Review of Systems:  No headache, visual changes, nausea, vomiting, diarrhea, constipation, dizziness, abdominal pain, skin rash, fevers, chills, night sweats, weight loss,  swollen lymph nodes, body aches, joint swelling, chest pain, shortness of breath, mood changes. POSITIVE muscle aches  Objective  Blood pressure 110/82, pulse 77, height 5\' 6"  (1.676 m), weight 224 lb (101.6 kg), SpO2 98 %.   General: No apparent distress alert and oriented x3 mood and affect normal, dressed appropriately.  HEENT: Pupils equal, extraocular movements intact  Respiratory: Patient's speak in full sentences and does not appear short of breath  Cardiovascular: No lower extremity edema, non tender, no erythema  Right shoulder exam does have some positive impingement noted.  Patient does have positive crossover noted.  Procedure: Real-time Ultrasound Guided Injection of right acromioclavicular joint Device: GE Logiq Q7  Ultrasound guided injection is preferred based studies that show increased duration, increased effect, greater accuracy, decreased procedural pain, increased response rate with ultrasound guided versus blind injection.  Verbal informed consent obtained.  Time-out conducted.  Noted no overlying erythema, induration, or other signs of local infection.  Skin prepped in a sterile fashion.  Local anesthesia: Topical Ethyl chloride.  With sterile technique and under real time ultrasound guidance:  Joint visualized.  23g 1  inch needle inserted posterior approach.  With a 25-gauge half inch needle injected with 0.5 cc of 0.5% Marcaine and 0.5 cc of Kenalog 40 mg/mL Completed without difficulty  Pain immediately resolved suggesting accurate placement of the medication.  Advised to call if fevers/chills, erythema, induration, drainage, or persistent bleeding.  Impression: Technically successful ultrasound guided injection.    Impression and Recommendations:     The above documentation has been reviewed and is accurate and complete Judi Saa, DO

## 2022-09-03 ENCOUNTER — Ambulatory Visit (INDEPENDENT_AMBULATORY_CARE_PROVIDER_SITE_OTHER): Payer: BC Managed Care – PPO | Admitting: Family Medicine

## 2022-09-03 ENCOUNTER — Ambulatory Visit: Payer: Self-pay

## 2022-09-03 VITALS — BP 110/82 | HR 77 | Ht 66.0 in | Wt 224.0 lb

## 2022-09-03 DIAGNOSIS — K2990 Gastroduodenitis, unspecified, without bleeding: Secondary | ICD-10-CM

## 2022-09-03 DIAGNOSIS — K2970 Gastritis, unspecified, without bleeding: Secondary | ICD-10-CM

## 2022-09-03 DIAGNOSIS — M25511 Pain in right shoulder: Secondary | ICD-10-CM

## 2022-09-03 DIAGNOSIS — M7581 Other shoulder lesions, right shoulder: Secondary | ICD-10-CM | POA: Diagnosis not present

## 2022-09-03 DIAGNOSIS — R112 Nausea with vomiting, unspecified: Secondary | ICD-10-CM

## 2022-09-03 NOTE — Assessment & Plan Note (Signed)
Chronic problem with worsening symptoms.  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  Had improvement in range of motion almost immediately.  Concerned with patient's ultrasound now that there is a possibility for underlying bursitis.  If worsening pain consider a glenohumeral injection.  Hopefully patient will respond to this injection and follow-up with me again in 3 months

## 2022-09-03 NOTE — Patient Instructions (Addendum)
Injected AC jt today 3 IBU 3x a day for 3 days Referral for GI See me again in 6-8 weeks

## 2022-09-03 NOTE — Assessment & Plan Note (Signed)
Patient was hospitalized for more nausea and vomiting.  Denies any weight loss.  Still having stomach issues and referred to gastroenterology

## 2022-09-15 ENCOUNTER — Encounter: Payer: Self-pay | Admitting: Gastroenterology

## 2022-10-13 NOTE — Progress Notes (Unsigned)
[name] [address] [place] [address] Phone: [phone] Subjective:   Bruce Donath, am serving as a scribe for Dr. Antoine Primas.  I'm seeing this patient by the request  of:  Plotnikov, Georgina Quint, MD  CC: Right shoulder and hand pain  [id number]  09/03/2022 Chronic problem with worsening symptoms.  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  Had improvement in range of motion almost immediately.  Concerned with patient's ultrasound now that there is a possibility for underlying bursitis.  If worsening pain consider a glenohumeral injection.  Hopefully patient will respond to this injection and follow-up with me again in 3 months     Update 10/15/2022 [name] Ancel Shone. is a 42 y.o. male coming in with complaint of R AC joint pain. Patient states that he is better but does still have pain. Worse with flexion over superior aspect.  Would state overall he is feeling 60% better.  No longer waking him up at night.  Not affecting daily activities as much.  Discussed which activities to do and which ones to avoid.  Increase activity slowly.  Follow-up again in 6 to 8 weeks  -     No past medical history on file. No past surgical history on file. Social History   Socioeconomic History   Marital status: Married    Spouse name: Not on file   Number of children: 0   Years of education: 12   Highest education level: Not on file  Occupational History   Occupation: Sport and exercise psychologist  Tobacco Use   Smoking status: Never   Smokeless tobacco: Never  Substance and Sexual Activity   Alcohol use: Yes    Comment: Socially   Drug use: No   Sexual activity: Not on file  Other Topics Concern   Not on file  Social History Narrative   Fun: Sleep    Social Determinants of Health   Financial Resource Strain: Not on file  Food Insecurity: Not on file  Transportation Needs: Not on file  Physical Activity: Not on file  Stress:  Not on file  Social Connections: Not on file   No Known Allergies Family History  Problem Relation Age of Onset   Hypertension Mother    Gout Father     Current Outpatient Medications (Endocrine & Metabolic):    levothyroxine (SYNTHROID) 25 MCG tablet, Take 1 tablet (25 mcg total) by mouth daily.    Current Outpatient Medications (Analgesics):    SUMAtriptan (IMITREX) 100 MG tablet, Take 1 tablet (100 mg total) by mouth every 2 (two) hours as needed for migraine. May repeat in 2 hours if headache persists or recurs. (Patient taking differently: Take 100 mg by mouth every 2 (two) hours as needed for migraine (and may repeat in 2 hours if headache persists or recurs.).)   TYLENOL 8 HOUR 650 MG CR tablet, Take 1,300 mg by mouth every 8 (eight) hours as needed (for headaches).   Current Outpatient Medications (Other):    B Complex-Folic Acid (B COMPLEX PLUS) TABS, Take 1 tablet by mouth daily.   Cholecalciferol (VITAMIN D3) 50 MCG (2000 UT) CAPS, Take 1 capsule (2,000 Units total) by mouth daily.   dicyclomine (BENTYL) 20 MG tablet, Take 1 tablet (20 mg total) by mouth 2 (two) times daily.   famotidine (PEPCID) 20 MG tablet, Take 1 tablet (20 mg total) by mouth 2 (two) times daily.   metroNIDAZOLE (FLAGYL) 500 MG tablet, Take  1 tablet (500 mg total) by mouth 3 (three) times daily.   ondansetron (ZOFRAN) 4 MG tablet, Take 1 tablet (4 mg total) by mouth every 6 (six) hours.   Probiotic Product (ALIGN) 4 MG CAPS, Take 1 capsule (4 mg total) by mouth daily.   Vitamin D, Ergocalciferol, (DRISDOL) 1.25 MG (50000 UNIT) CAPS capsule, Take 1 capsule (50,000 Units total) by mouth every 7 (seven) days.   Reviewed prior external information including notes and imaging from  primary care provider As well as notes that were available from care everywhere and other healthcare systems.  Past medical history, social, surgical and family history all reviewed in electronic medical record.  No pertanent  information unless stated regarding to the chief complaint.   Review of Systems:  No headache, visual changes, nausea, vomiting, diarrhea, constipation, dizziness, abdominal pain, skin rash, fevers, chills, night sweats, weight loss, swollen lymph nodes, body aches, joint swelling, chest pain, shortness of breath, mood changes. POSITIVE muscle aches  Objective  Blood pressure (!) 128/92, pulse 80, height 5\' 6"  (1.676 m), weight 225 lb (102.1 kg), SpO2 96 %.   General: No apparent distress alert and oriented x3 mood and affect normal, dressed appropriately.  HEENT: Pupils equal, extraocular movements intact  Respiratory: Patient's speak in full sentences and does not appear short of breath  Cardiovascular: No lower extremity edema, non tender, no erythema  Patient right shoulder does have positive crossover noted.  Still some pain with internal rotation.  Right wrist exam good grip strength noted.  Positive Tinel's noted.  No thenar eminence wasting  Limited muscular skeletal ultrasound was performed and interpreted by Antoine Primas, M  Limited ultrasound of patient's right wrist shows dilatation and hypoechoic changes noted of the median nerve sheath patient does have tightness of the vernacular noted. Impression: Moderate carpal tunnel syndrome    Impression and Recommendations:     The above documentation has been reviewed and is accurate and complete Judi Saa, DO

## 2022-10-15 ENCOUNTER — Ambulatory Visit (INDEPENDENT_AMBULATORY_CARE_PROVIDER_SITE_OTHER): Payer: BC Managed Care – PPO | Admitting: Family Medicine

## 2022-10-15 ENCOUNTER — Other Ambulatory Visit: Payer: Self-pay

## 2022-10-15 ENCOUNTER — Encounter: Payer: Self-pay | Admitting: Family Medicine

## 2022-10-15 VITALS — BP 128/92 | HR 80 | Ht 66.0 in | Wt 225.0 lb

## 2022-10-15 DIAGNOSIS — G5601 Carpal tunnel syndrome, right upper limb: Secondary | ICD-10-CM | POA: Diagnosis not present

## 2022-10-15 DIAGNOSIS — M7581 Other shoulder lesions, right shoulder: Secondary | ICD-10-CM | POA: Diagnosis not present

## 2022-10-15 DIAGNOSIS — M25511 Pain in right shoulder: Secondary | ICD-10-CM | POA: Diagnosis not present

## 2022-10-15 NOTE — Patient Instructions (Signed)
Carpal tunnel  R wrist brace at night See me in 2-3 months

## 2022-10-15 NOTE — Assessment & Plan Note (Signed)
We discussed the anatomy involved, and that carpal tunnel syndrome primarily involves the median nerve, and this typically affects digits one through 3.   We also discussed that mild cases of carpal tunnel syndrome are often improved with night splints.  If symptoms persist it is very reasonable to consider a carpal tunnel injection.   If the patient does have moderate to severe carpal tunnel syndrome based on NCV, then it is certainly reasonable to consider surgical consultation for definitive management possible carpal tunnel release.  We also discussed his severe carpal tunnel syndrome can lead to permanent nerve impairment even if released. At this point, the patient like to proceed conservatively. Follow-up again in 2 to 3 months

## 2022-10-15 NOTE — Assessment & Plan Note (Signed)
Patient is doing much better at this time.  Hopeful that this injection will help.  Did discuss the potential for PRP.  Continue to monitor different lifting mechanics.  Can take meloxicam for the breakthrough as needed.  Follow-up with me again 2 to 3 months

## 2022-11-24 ENCOUNTER — Ambulatory Visit: Payer: BC Managed Care – PPO | Admitting: Gastroenterology

## 2023-02-18 ENCOUNTER — Ambulatory Visit: Payer: BC Managed Care – PPO | Admitting: Internal Medicine

## 2023-02-18 ENCOUNTER — Ambulatory Visit (INDEPENDENT_AMBULATORY_CARE_PROVIDER_SITE_OTHER): Payer: BC Managed Care – PPO

## 2023-02-18 ENCOUNTER — Encounter: Payer: Self-pay | Admitting: Internal Medicine

## 2023-02-18 VITALS — BP 128/82 | HR 86 | Temp 98.2°F | Ht 66.0 in | Wt 228.0 lb

## 2023-02-18 DIAGNOSIS — E559 Vitamin D deficiency, unspecified: Secondary | ICD-10-CM | POA: Diagnosis not present

## 2023-02-18 DIAGNOSIS — R058 Other specified cough: Secondary | ICD-10-CM

## 2023-02-18 DIAGNOSIS — E538 Deficiency of other specified B group vitamins: Secondary | ICD-10-CM | POA: Diagnosis not present

## 2023-02-18 MED ORDER — AZITHROMYCIN 250 MG PO TABS: ORAL_TABLET | ORAL | 1 refills | Status: AC

## 2023-02-18 MED ORDER — HYDROCODONE BIT-HOMATROP MBR 5-1.5 MG/5ML PO SOLN: 5.0000 mL | Freq: Four times a day (QID) | ORAL | 0 refills | Status: AC | PRN

## 2023-02-18 NOTE — Assessment & Plan Note (Signed)
Lab Results  Component Value Date   VITAMINB12 277 07/27/2022   Low, to start oral replacement - b12 1000 mcg qd

## 2023-02-18 NOTE — Assessment & Plan Note (Signed)
Last vitamin D Lab Results  Component Value Date   VD25OH 23.58 (L) 07/27/2022   Low, reminded to start oral replacement

## 2023-02-18 NOTE — Patient Instructions (Signed)
Please take all new medication as prescribed - the antibiotic, cough medicine as needed  Please continue all other medications as before, and refills have been done if requested.  Please have the pharmacy call with any other refills you may need.  Please keep your appointments with your specialists as you may have planned  Please go to the XRAY Department in the first floor for the x-ray testing  You will be contacted by phone if any changes need to be made immediately.  Otherwise, you will receive a letter about your results with an explanation, but please check with MyChart first.

## 2023-02-18 NOTE — Progress Notes (Signed)
Patient ID: Dylan Thornton., male   DOB: 06/21/1980, 42 y.o.   MRN: [id number]        Chief Complaint: follow up prod cough with congestion and exp wheezing, low vit d and b12       HPI:  Dylan Thornton. is a 42 y.o. male here with c/o 3 wks onset illness after young son with cough and congestion dx with allergies;  Here with acute onset fever, facial pain, pressure, headache, general weakness and malaise, and greenish d/c, with mild ST and cough, but pt denies chest pain, wheezing, increased sob or doe, orthopnea, PND, increased LE swelling, palpitations, dizziness or syncope.   Pt denies polydipsia, polyuria, or new focal neuro s/s.     Wt Readings from Last 3 Encounters:  02/18/23 228 lb (103.4 kg)  10/15/22 225 lb (102.1 kg)  09/03/22 224 lb (101.6 kg)   BP Readings from Last 3 Encounters:  02/18/23 128/82  10/15/22 (!) 128/92  09/03/22 110/82         History reviewed. No pertinent past medical history. History reviewed. No pertinent surgical history.  reports that he has never smoked. He has never used smokeless tobacco. He reports current alcohol use. He reports that he does not use drugs. family history includes Gout in his father; Hypertension in his mother. No Known Allergies Current Outpatient Medications on File Prior to Visit  Medication Sig Dispense Refill   B Complex-Folic Acid (B COMPLEX PLUS) TABS Take 1 tablet by mouth daily. 100 tablet 3   Cholecalciferol (VITAMIN D3) 50 MCG (2000 UT) CAPS Take 1 capsule (2,000 Units total) by mouth daily. 100 capsule 3   dicyclomine (BENTYL) 20 MG tablet Take 1 tablet (20 mg total) by mouth 2 (two) times daily. 20 tablet 0   famotidine (PEPCID) 20 MG tablet Take 1 tablet (20 mg total) by mouth 2 (two) times daily. 15 tablet 0   levothyroxine (SYNTHROID) 25 MCG tablet Take 1 tablet (25 mcg total) by mouth daily. 30 tablet 5   metroNIDAZOLE (FLAGYL) 500 MG tablet Take 1 tablet (500 mg total) by mouth 3 (three) times daily. 30  tablet 0   ondansetron (ZOFRAN) 4 MG tablet Take 1 tablet (4 mg total) by mouth every 6 (six) hours. 12 tablet 0   Probiotic Product (ALIGN) 4 MG CAPS Take 1 capsule (4 mg total) by mouth daily. 30 capsule 1   SUMAtriptan (IMITREX) 100 MG tablet Take 1 tablet (100 mg total) by mouth every 2 (two) hours as needed for migraine. May repeat in 2 hours if headache persists or recurs. (Patient taking differently: Take 100 mg by mouth every 2 (two) hours as needed for migraine (and may repeat in 2 hours if headache persists or recurs.).) 12 tablet 4   TYLENOL 8 HOUR 650 MG CR tablet Take 1,300 mg by mouth every 8 (eight) hours as needed (for headaches).     Vitamin D, Ergocalciferol, (DRISDOL) 1.25 MG (50000 UNIT) CAPS capsule Take 1 capsule (50,000 Units total) by mouth every 7 (seven) days. 8 capsule 0   No current facility-administered medications on file prior to visit.        ROS:  All others reviewed and negative.  Objective        PE:  BP 128/82 (BP Location: Right Arm, Patient Position: Sitting, Cuff Size: Normal)   Pulse 86   Temp 98.2 F (36.8 C) (Oral)   Ht 5\' 6"  (1.676 m)   Wt 228 lb (  103.4 kg)   SpO2 96%   BMI 36.80 kg/m                 Constitutional: Pt appears in NAD               HENT: Head: NCAT.                Right Ear: External ear normal.                 Left Ear: External ear normal. Bilat tm's with mild erythema.  Max sinus areas mild tender.  Pharynx with mild erythema, no exudate               Eyes: . Pupils are equal, round, and reactive to light. Conjunctivae and EOM are normal               Nose: without d/c or deformity               Neck: Neck supple. Gross normal ROM               Cardiovascular: Normal rate and regular rhythm.                 Pulmonary/Chest: Effort normal and breath sounds without rales or wheezing.                Abd:  Soft, NT, ND, + BS, no organomegaly               Neurological: Pt is alert. At baseline orientation, motor grossly  intact               Skin: Skin is warm. No rashes, no other new lesions, LE edema - none               Psychiatric: Pt behavior is normal without agitation   Micro: none  Cardiac tracings I have personally interpreted today:  none  Pertinent Radiological findings (summarize): none   Lab Results  Component Value Date   WBC 4.6 07/20/2022   HGB 16.2 07/20/2022   HCT 47.7 07/20/2022   PLT 289 07/20/2022   GLUCOSE 78 07/27/2022   CHOL 174 09/24/2017   TRIG 88.0 09/24/2017   HDL 37.40 (L) 09/24/2017   LDLCALC 119 (H) 09/24/2017   ALT 23 07/27/2022   AST 22 07/27/2022   NA 138 07/27/2022   K 4.9 07/27/2022   CL 101 07/27/2022   CREATININE 1.43 07/27/2022   BUN 14 07/27/2022   CO2 28 07/27/2022   TSH 0.98 06/05/2020   Assessment/Plan:  Dylan Thornton. is a 42 y.o. Black or African American [2] male with  has no past medical history on file.  Productive cough Mild to mod, for antibx course, zpack, cough med prn, also cxr r/o pna,  to f/u any worsening symptoms or concerns  Vitamin D deficiency Last vitamin D Lab Results  Component Value Date   VD25OH 23.58 (L) 07/27/2022   Low, reminded to start oral replacement   Low serum vitamin B12 Lab Results  Component Value Date   VITAMINB12 277 07/27/2022   Low, to start oral replacement - b12 1000 mcg qd  Followup: Return if symptoms worsen or fail to improve.  Oliver Barre, MD 02/18/2023 10:42 AM [facility]

## 2023-02-18 NOTE — Assessment & Plan Note (Signed)
Mild to mod, for antibx course, zpack, cough med prn, also cxr r/o pna,  to f/u any worsening symptoms or concerns

## 2023-06-10 ENCOUNTER — Ambulatory Visit: Payer: 59 | Admitting: Sports Medicine

## 2023-06-10 VITALS — BP 122/84 | HR 101 | Ht 66.0 in | Wt 237.0 lb

## 2023-06-10 DIAGNOSIS — M7581 Other shoulder lesions, right shoulder: Secondary | ICD-10-CM | POA: Diagnosis not present

## 2023-06-10 DIAGNOSIS — G8929 Other chronic pain: Secondary | ICD-10-CM | POA: Diagnosis not present

## 2023-06-10 DIAGNOSIS — M7551 Bursitis of right shoulder: Secondary | ICD-10-CM

## 2023-06-10 DIAGNOSIS — M25511 Pain in right shoulder: Secondary | ICD-10-CM

## 2023-06-10 NOTE — Progress Notes (Signed)
Dylan Thornton D.[facility] [address] [place] [address] Phone: [phone]   Assessment and Plan:     1. Chronic right shoulder pain 2. Subacromial bursitis of right shoulder joint 3. Pain in right acromioclavicular joint 4. AC (acromioclavicular) joint bone spurs, right  -Chronic with exacerbation, subsequent visit - Consistent with recurrence of right shoulder pain.  Symptoms are most consistent with subacromial bursitis at today's visit.  Patient does have tenderness to [facility] joint with history of bone spurs, though this is secondary to his broader shoulder pain - Patient elected for subacromial CSI.  Tolerated well per note below - Continue HEP - May use Tylenol for day-to-day pain relief  Procedure: Subacromial Injection Side: Right  Risks explained and consent was given verbally. The site was cleaned with alcohol prep. A steroid injection was performed from posterior approach using 2mL of 1% lidocaine without epinephrine and 1mL of kenalog 40mg /ml. This was well tolerated and resulted in symptomatic relief.  Needle was removed, hemostasis achieved, and post injection instructions were explained.   Pt was advised to call or return to clinic if these symptoms worsen or fail to improve as anticipated.   Pertinent previous records reviewed include none  Follow Up: As needed if no improvement or worsening of symptoms.  Could consider AC joint CSI   Subjective:   I, Dylan Thornton, am serving as a Neurosurgeon for Doctor [employer]  Chief Complaint: right arm and neck pain   HPI:  09/03/2022 Chronic problem with worsening symptoms.  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  Had improvement in range of motion almost immediately.  Concerned with patient's ultrasound now that there is a possibility for underlying bursitis.  If worsening pain consider a glenohumeral injection.  Hopefully patient will respond to this injection and  follow-up with me again in 3 months    Update 10/15/2022 Dylan Thornton. is a 43 y.o. male coming in with complaint of R AC joint pain. Patient states that he is better but does still have pain. Worse with flexion over superior aspect.  Would state overall he is feeling 60% better.  No longer waking him up at night.  Not affecting daily activities as much.  Discussed which activities to do and which ones to avoid.  Increase activity slowly.  Follow-up again in 6 to 8 weeks  06/10/23 Patient states right shoulder pain he is getting ready to go out of town and has had pain for about 1 month    Relevant Historical Information: None pertinent  Additional pertinent review of systems negative.   Current Outpatient Medications:    B Complex-Folic Acid (B COMPLEX PLUS) TABS, Take 1 tablet by mouth daily., Disp: 100 tablet, Rfl: 3   Cholecalciferol (VITAMIN D3) 50 MCG (2000 UT) CAPS, Take 1 capsule (2,000 Units total) by mouth daily., Disp: 100 capsule, Rfl: 3   dicyclomine (BENTYL) 20 MG tablet, Take 1 tablet (20 mg total) by mouth 2 (two) times daily., Disp: 20 tablet, Rfl: 0   famotidine (PEPCID) 20 MG tablet, Take 1 tablet (20 mg total) by mouth 2 (two) times daily., Disp: 15 tablet, Rfl: 0   levothyroxine (SYNTHROID) 25 MCG tablet, Take 1 tablet (25 mcg total) by mouth daily., Disp: 30 tablet, Rfl: 5   metroNIDAZOLE (FLAGYL) 500 MG tablet, Take 1 tablet (500 mg total) by mouth 3 (three) times daily., Disp: 30 tablet, Rfl: 0   ondansetron (ZOFRAN) 4 MG  tablet, Take 1 tablet (4 mg total) by mouth every 6 (six) hours., Disp: 12 tablet, Rfl: 0   Probiotic Product (ALIGN) 4 MG CAPS, Take 1 capsule (4 mg total) by mouth daily., Disp: 30 capsule, Rfl: 1   SUMAtriptan (IMITREX) 100 MG tablet, Take 1 tablet (100 mg total) by mouth every 2 (two) hours as needed for migraine. May repeat in 2 hours if headache persists or recurs. (Patient taking differently: Take 100 mg by mouth every 2 (two) hours as  needed for migraine (and may repeat in 2 hours if headache persists or recurs.).), Disp: 12 tablet, Rfl: 4   TYLENOL 8 HOUR 650 MG CR tablet, Take 1,300 mg by mouth every 8 (eight) hours as needed (for headaches)., Disp: , Rfl:    Vitamin D, Ergocalciferol, (DRISDOL) 1.25 MG (50000 UNIT) CAPS capsule, Take 1 capsule (50,000 Units total) by mouth every 7 (seven) days., Disp: 8 capsule, Rfl: 0   Objective:     Vitals:   06/10/23 0959  BP: 122/84  Pulse: (!) 101  SpO2: 96%  Weight: 237 lb (107.5 kg)  Height: 5\' 6"  (1.676 m)      Body mass index is 38.25 kg/m.    Physical Exam:    Gen: Appears well, nad, nontoxic and pleasant Neuro:sensation intact, strength is 5/5 with df/pf/inv/ev, muscle tone wnl Skin: no suspicious lesion or defmority Psych: A&O, appropriate mood and affect  Right shoulder:  No deformity, swelling or muscle wasting No scapular winging FF 170, abd 160, int 20, ext 80 TTP AC NTTP over the [place], clavicle,   coracoid, biceps groove, humerus, deltoid, trapezius, cervical spine Positive neer, hawkins, empty can, obriens, crossarm, subscap liftoff, speeds Neg ant drawer, sulcus sign, apprehension Negative Spurling's test bilat FROM of neck    Electronically signed by:  Dylan Thornton D.[facility] 10:20 AM 06/10/23

## 2023-06-23 NOTE — Progress Notes (Unsigned)
[name] [address] [place] [address] Phone: [phone] Subjective:   Bruce Donath, am serving as a scribe for Dr. Antoine Primas.  I'm seeing this patient by the request  of:  Plotnikov, Georgina Quint, MD  CC: Right shoulder pain follow-up  [id number]  Dylan Thornton. is a 43 y.o. male coming in with complaint of R shoulder pain. Injection from Dr. Jean Rosenthal on 06/10/2023.Last seen by Dr. Katrinka Blazing 10/2022 for carpal tunnel. Patient states that he has some pain on back of shoulder even after injection. Painful when shaving his head. Does not have a lot of pain throughout the day but modifies his work.   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   We have seen him for wrist pain in June as well as showed carpal tunnel syndrome.  No past medical history on file. No past surgical history on file. Social History   Socioeconomic History   Marital status: Married    Spouse name: Not on file   Number of children: 0   Years of education: 12   Highest education level: Not on file  Occupational History   Occupation: Sport and exercise psychologist  Tobacco Use   Smoking status: Never   Smokeless tobacco: Never  Substance and Sexual Activity   Alcohol use: Yes    Comment: Socially   Drug use: No   Sexual activity: Not on file  Other Topics Concern   Not on file  Social History Narrative   Fun: Sleep    Social Drivers of Health   Financial Resource Strain: Not on file  Food Insecurity: Not on file  Transportation Needs: Not on file  Physical Activity: Not on file  Stress: Not on file  Social Connections: Not on file   No Known Allergies Family History  Problem Relation Age of Onset   Hypertension Mother    Gout Father     Current Outpatient Medications (Endocrine & Metabolic):    levothyroxine (SYNTHROID) 25 MCG tablet, Take 1 tablet (25 mcg total) by mouth daily.    Current  Outpatient Medications (Analgesics):    SUMAtriptan (IMITREX) 100 MG tablet, Take 1 tablet (100 mg total) by mouth every 2 (two) hours as needed for migraine. May repeat in 2 hours if headache persists or recurs. (Patient taking differently: Take 100 mg by mouth every 2 (two) hours as needed for migraine (and may repeat in 2 hours if headache persists or recurs.).)   TYLENOL 8 HOUR 650 MG CR tablet, Take 1,300 mg by mouth every 8 (eight) hours as needed (for headaches).   Current Outpatient Medications (Other):    B Complex-Folic Acid (B COMPLEX PLUS) TABS, Take 1 tablet by mouth daily.   Cholecalciferol (VITAMIN D3) 50 MCG (2000 UT) CAPS, Take 1 capsule (2,000 Units total) by mouth daily.   dicyclomine (BENTYL) 20 MG tablet, Take 1 tablet (20 mg total) by mouth 2 (two) times daily.   famotidine (PEPCID) 20 MG tablet, Take 1 tablet (20 mg total) by mouth 2 (two) times daily.   metroNIDAZOLE (FLAGYL) 500 MG tablet, Take 1 tablet (500 mg total) by mouth 3 (three) times daily.   ondansetron (ZOFRAN) 4 MG tablet, Take 1 tablet (4 mg total) by mouth every 6 (six) hours.   Probiotic Product (ALIGN) 4 MG CAPS, Take 1 capsule (4 mg total) by mouth daily.   Vitamin D, Ergocalciferol, (DRISDOL) 1.25 MG (50000 UNIT) CAPS capsule,  Take 1 capsule (50,000 Units total) by mouth every 7 (seven) days.   Reviewed prior external information including notes and imaging from  primary care provider As well as notes that were available from care everywhere and other healthcare systems.  Past medical history, social, surgical and family history all reviewed in electronic medical record.  No pertanent information unless stated regarding to the chief complaint.   Review of Systems:  No headache, visual changes, nausea, vomiting, diarrhea, constipation, dizziness, abdominal pain, skin rash, fevers, chills, night sweats, weight loss, swollen lymph nodes, body aches, joint swelling, chest pain, shortness of breath, mood  changes. POSITIVE muscle aches  Objective  Blood pressure 108/82, pulse 82, height 5\' 6"  (1.676 m), weight 235 lb (106.6 kg), SpO2 98%.   General: No apparent distress alert and oriented x3 mood and affect normal, dressed appropriately.  HEENT: Pupils equal, extraocular movements intact  Respiratory: Patient's speak in full sentences and does not appear short of breath  Cardiovascular: No lower extremity edema, non tender, no erythema  Patient's right shoulder does have positive impingement noted.  It is severely tender to palpation of the acromioclavicular joint.  Procedure: Real-time Ultrasound Guided Injection of right acromioclavicular joint Device: GE Logiq Q7 Ultrasound guided injection is preferred based studies that show increased duration, increased effect, greater accuracy, decreased procedural pain, increased response rate, and decreased cost with ultrasound guided versus blind injection.  Verbal informed consent obtained.  Time-out conducted.  Noted no overlying erythema, induration, or other signs of local infection.  Skin prepped in a sterile fashion.  Local anesthesia: Topical Ethyl chloride.  With sterile technique and under real time ultrasound guidance: With a 25-gauge half inch needle injected with 0.5 cc of 0.5% Marcaine and 0.5 cc of Kenalog 40 mg/mL. Completed without difficulty  Pain immediately resolved suggesting accurate placement of the medication.  Advised to call if fevers/chills, erythema, induration, drainage, or persistent bleeding.  Impression: Technically successful ultrasound guided injection.    Impression and Recommendations:      The above documentation has been reviewed and is accurate and complete Judi Saa, DO

## 2023-06-24 ENCOUNTER — Ambulatory Visit: Payer: 59 | Admitting: Family Medicine

## 2023-06-24 ENCOUNTER — Other Ambulatory Visit: Payer: Self-pay

## 2023-06-24 ENCOUNTER — Encounter: Payer: Self-pay | Admitting: Family Medicine

## 2023-06-24 VITALS — BP 108/82 | HR 82 | Ht 66.0 in | Wt 235.0 lb

## 2023-06-24 DIAGNOSIS — M25511 Pain in right shoulder: Secondary | ICD-10-CM | POA: Diagnosis not present

## 2023-06-24 DIAGNOSIS — M7581 Other shoulder lesions, right shoulder: Secondary | ICD-10-CM | POA: Diagnosis not present

## 2023-06-24 NOTE — Assessment & Plan Note (Signed)
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  Could be a candidate for possible osteopathic manipulation as well in the long-term.  Discussed icing regimen and home exercises, which activities to do and which ones to avoid.  Increase activity slowly otherwise.

## 2023-06-24 NOTE — Patient Instructions (Signed)
Injected ac joint today. Keep hands in Periferal vision. See me in 8 weeks if we need more manipulation.
